# Patient Record
Sex: Female | Born: 2000 | Race: Black or African American | Hispanic: No | Marital: Single | Smoking: Never smoker
Health system: Southern US, Community
[De-identification: ages and names within clinical notes are randomized; demographics above are authoritative.]

## PROBLEM LIST (undated history)

## (undated) DIAGNOSIS — E559 Vitamin D deficiency, unspecified: Secondary | ICD-10-CM

## (undated) DIAGNOSIS — Z789 Other specified health status: Secondary | ICD-10-CM

## (undated) DIAGNOSIS — J45909 Unspecified asthma, uncomplicated: Secondary | ICD-10-CM

## (undated) HISTORY — PX: NO PAST SURGERIES: SHX2092

## (undated) HISTORY — DX: Vitamin D deficiency, unspecified: E55.9

## (undated) HISTORY — DX: Unspecified asthma, uncomplicated: J45.909

---
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
## 2022-01-02 ENCOUNTER — Inpatient Hospital Stay (HOSPITAL_COMMUNITY): Payer: Medicaid Other | Admitting: Anesthesiology

## 2022-01-02 ENCOUNTER — Inpatient Hospital Stay (HOSPITAL_COMMUNITY)
Admission: AD | Admit: 2022-01-02 | Discharge: 2022-01-04 | DRG: 807 | Disposition: A | Discharge status: Home / Self Care | Payer: Medicaid Other | Attending: Obstetrics & Gynecology | Admitting: Obstetrics & Gynecology

## 2022-01-02 ENCOUNTER — Encounter (HOSPITAL_COMMUNITY): Payer: Self-pay | Admitting: Obstetrics & Gynecology

## 2022-01-02 DIAGNOSIS — Z23 Encounter for immunization: Secondary | ICD-10-CM | POA: Diagnosis not present

## 2022-01-02 DIAGNOSIS — O9952 Diseases of the respiratory system complicating childbirth: Principal | ICD-10-CM | POA: Diagnosis present

## 2022-01-02 DIAGNOSIS — Z3A39 39 weeks gestation of pregnancy: Secondary | ICD-10-CM

## 2022-01-02 DIAGNOSIS — Z8613 Personal history of malaria: Secondary | ICD-10-CM

## 2022-01-02 DIAGNOSIS — J45909 Unspecified asthma, uncomplicated: Secondary | ICD-10-CM | POA: Diagnosis present

## 2022-01-02 DIAGNOSIS — O26893 Other specified pregnancy related conditions, third trimester: Secondary | ICD-10-CM | POA: Diagnosis present

## 2022-01-02 LAB — CBC
HCT: 35.2 % — ABNORMAL LOW (ref 36.0–46.0)
Hemoglobin: 11 g/dL — ABNORMAL LOW (ref 12.0–15.0)
MCH: 29.3 pg (ref 26.0–34.0)
MCHC: 31.3 g/dL (ref 30.0–36.0)
MCV: 93.9 fL (ref 80.0–100.0)
Platelets: 216 10*3/uL (ref 150–400)
RBC: 3.75 MIL/uL — ABNORMAL LOW (ref 3.87–5.11)
RDW: 14.1 % (ref 11.5–15.5)
WBC: 8.8 10*3/uL (ref 4.0–10.5)
nRBC: 0 % (ref 0.0–0.2)

## 2022-01-02 LAB — TYPE AND SCREEN
ABO/RH(D): AB POS
Antibody Screen: NEGATIVE

## 2022-01-02 MED ORDER — OXYCODONE HCL 5 MG PO TABS: 10.0000 mg | ORAL_TABLET | ORAL | Status: DC | PRN

## 2022-01-02 MED ORDER — ACETAMINOPHEN 325 MG PO TABS: 650.0000 mg | ORAL_TABLET | ORAL | Status: DC | PRN

## 2022-01-02 MED ORDER — OXYCODONE HCL 5 MG PO TABS: 5.0000 mg | ORAL_TABLET | ORAL | Status: DC | PRN

## 2022-01-02 MED ORDER — LACTATED RINGERS IV SOLN: 500.0000 mL | INTRAVENOUS | Status: DC | PRN

## 2022-01-02 MED ORDER — PHENYLEPHRINE 80 MCG/ML (10ML) SYRINGE FOR IV PUSH (FOR BLOOD PRESSURE SUPPORT)
80.0000 ug | PREFILLED_SYRINGE | INTRAVENOUS | Status: DC | PRN
  Filled 2022-01-02: qty 10

## 2022-01-02 MED ORDER — ONDANSETRON HCL 4 MG/2ML IJ SOLN: 4.0000 mg | Freq: Four times a day (QID) | INTRAMUSCULAR | Status: DC | PRN

## 2022-01-02 MED ORDER — OXYCODONE-ACETAMINOPHEN 5-325 MG PO TABS: 2.0000 | ORAL_TABLET | ORAL | Status: DC | PRN

## 2022-01-02 MED ORDER — PRENATAL MULTIVITAMIN CH
1.0000 | ORAL_TABLET | Freq: Every day | ORAL | Status: DC
  Administered 2022-01-03 – 2022-01-04 (×2): 1 via ORAL
  Filled 2022-01-02 (×2): qty 1

## 2022-01-02 MED ORDER — FENTANYL-BUPIVACAINE-NACL 0.5-0.125-0.9 MG/250ML-% EP SOLN
12.0000 mL/h | EPIDURAL | Status: DC | PRN
  Filled 2022-01-02: qty 250

## 2022-01-02 MED ORDER — EPHEDRINE 5 MG/ML INJ: 10.0000 mg | INTRAVENOUS | Status: DC | PRN

## 2022-01-02 MED ORDER — PHENYLEPHRINE 80 MCG/ML (10ML) SYRINGE FOR IV PUSH (FOR BLOOD PRESSURE SUPPORT): 80.0000 ug | PREFILLED_SYRINGE | INTRAVENOUS | Status: DC | PRN

## 2022-01-02 MED ORDER — DIPHENHYDRAMINE HCL 25 MG PO CAPS: 25.0000 mg | ORAL_CAPSULE | Freq: Four times a day (QID) | ORAL | Status: DC | PRN

## 2022-01-02 MED ORDER — MAGNESIUM HYDROXIDE 400 MG/5ML PO SUSP: 30.0000 mL | ORAL | Status: DC | PRN

## 2022-01-02 MED ORDER — LACTATED RINGERS IV SOLN
500.0000 mL | Freq: Once | INTRAVENOUS | Status: AC
  Administered 2022-01-02: 500 mL via INTRAVENOUS

## 2022-01-02 MED ORDER — OXYTOCIN BOLUS FROM INFUSION
333.0000 mL | Freq: Once | INTRAVENOUS | Status: AC
  Administered 2022-01-02: 333 mL via INTRAVENOUS

## 2022-01-02 MED ORDER — FLEET ENEMA 7-19 GM/118ML RE ENEM: 1.0000 | ENEMA | RECTAL | Status: DC | PRN

## 2022-01-02 MED ORDER — COCONUT OIL OIL: 1.0000 | TOPICAL_OIL | Status: DC | PRN

## 2022-01-02 MED ORDER — SOD CITRATE-CITRIC ACID 500-334 MG/5ML PO SOLN: 30.0000 mL | ORAL | Status: DC | PRN

## 2022-01-02 MED ORDER — ONDANSETRON HCL 4 MG/2ML IJ SOLN: 4.0000 mg | INTRAMUSCULAR | Status: DC | PRN

## 2022-01-02 MED ORDER — SENNOSIDES-DOCUSATE SODIUM 8.6-50 MG PO TABS
2.0000 | ORAL_TABLET | Freq: Every day | ORAL | Status: DC
  Administered 2022-01-03 – 2022-01-04 (×2): 2 via ORAL
  Filled 2022-01-02 (×2): qty 2

## 2022-01-02 MED ORDER — ZOLPIDEM TARTRATE 5 MG PO TABS: 5.0000 mg | ORAL_TABLET | Freq: Every evening | ORAL | Status: DC | PRN

## 2022-01-02 MED ORDER — ONDANSETRON HCL 4 MG PO TABS: 4.0000 mg | ORAL_TABLET | ORAL | Status: DC | PRN

## 2022-01-02 MED ORDER — LIDOCAINE HCL (PF) 1 % IJ SOLN
INTRAMUSCULAR | Status: DC | PRN
  Administered 2022-01-02: 5 mL via EPIDURAL

## 2022-01-02 MED ORDER — LACTATED RINGERS IV SOLN: INTRAVENOUS | Status: DC

## 2022-01-02 MED ORDER — DIPHENHYDRAMINE HCL 50 MG/ML IJ SOLN: 12.5000 mg | INTRAMUSCULAR | Status: DC | PRN

## 2022-01-02 MED ORDER — SIMETHICONE 80 MG PO CHEW: 80.0000 mg | CHEWABLE_TABLET | ORAL | Status: DC | PRN

## 2022-01-02 MED ORDER — EPHEDRINE 5 MG/ML INJ
10.0000 mg | INTRAVENOUS | Status: DC | PRN
  Filled 2022-01-02: qty 5

## 2022-01-02 MED ORDER — IBUPROFEN 600 MG PO TABS
600.0000 mg | ORAL_TABLET | Freq: Four times a day (QID) | ORAL | Status: DC
  Administered 2022-01-02 – 2022-01-04 (×7): 600 mg via ORAL
  Filled 2022-01-02 (×7): qty 1

## 2022-01-02 MED ORDER — OXYTOCIN-SODIUM CHLORIDE 30-0.9 UT/500ML-% IV SOLN
2.5000 [IU]/h | INTRAVENOUS | Status: DC
  Filled 2022-01-02: qty 500

## 2022-01-02 MED ORDER — LIDOCAINE HCL (PF) 1 % IJ SOLN: 30.0000 mL | INTRAMUSCULAR | Status: DC | PRN

## 2022-01-02 MED ORDER — WITCH HAZEL-GLYCERIN EX PADS
1.0000 | MEDICATED_PAD | CUTANEOUS | Status: DC | PRN
  Administered 2022-01-04: 1 via TOPICAL

## 2022-01-02 MED ORDER — BENZOCAINE-MENTHOL 20-0.5 % EX AERO
1.0000 | INHALATION_SPRAY | CUTANEOUS | Status: DC | PRN
  Administered 2022-01-04: 1 via TOPICAL
  Filled 2022-01-02 (×2): qty 56

## 2022-01-02 MED ORDER — FENTANYL-BUPIVACAINE-NACL 0.5-0.125-0.9 MG/250ML-% EP SOLN
EPIDURAL | Status: DC | PRN
  Administered 2022-01-02: 12 mL/h via EPIDURAL

## 2022-01-02 MED ORDER — DIBUCAINE (PERIANAL) 1 % EX OINT: 1.0000 | TOPICAL_OINTMENT | CUTANEOUS | Status: DC | PRN

## 2022-01-02 MED ORDER — ACETAMINOPHEN 325 MG PO TABS
650.0000 mg | ORAL_TABLET | ORAL | Status: DC | PRN
  Administered 2022-01-03 – 2022-01-04 (×2): 650 mg via ORAL
  Filled 2022-01-02 (×2): qty 2

## 2022-01-02 MED ORDER — OXYCODONE-ACETAMINOPHEN 5-325 MG PO TABS: 1.0000 | ORAL_TABLET | ORAL | Status: DC | PRN

## 2022-01-02 NOTE — Anesthesia Procedure Notes (Signed)
Epidural Patient location during procedure: OB Start time: 01/02/2022 11:57 AM End time: 01/02/2022 12:09 PM  Staffing Anesthesiologist: Barnet Glasgow, MD Performed: anesthesiologist   Preanesthetic Checklist Completed: patient identified, IV checked, site marked, risks and benefits discussed, surgical consent, monitors and equipment checked, pre-op evaluation and timeout performed  Epidural Patient position: sitting Prep: DuraPrep and site prepped and draped Patient monitoring: continuous pulse ox and blood pressure Approach: midline Location: L2-L3 Injection technique: LOR air  Needle:  Needle type: Tuohy  Needle gauge: 17 G Needle length: 9 cm and 9 Needle insertion depth: 6 cm Catheter type: closed end flexible Catheter size: 19 Gauge Catheter at skin depth: 12 cm Test dose: negative  Assessment Events: blood not aspirated, injection not painful, no injection resistance, no paresthesia and negative IV test  Additional Notes Patient identified. Risks/Benefits/Options discussed with patient including but not limited to bleeding, infection, nerve damage, paralysis, failed block, incomplete pain control, headache, blood pressure changes, nausea, vomiting, reactions to medication both or allergic, itching and postpartum back pain. Confirmed with bedside nurse the patient's most recent platelet count. Confirmed with patient that they are not currently taking any anticoagulation, have any bleeding history or any family history of bleeding disorders. Patient expressed understanding and wished to proceed. All questions were answered. Sterile technique was used throughout the entire procedure. Please see nursing notes for vital signs. Test dose was given through epidural needle and negative prior to continuing to dose epidural or start infusion. Warning signs of high block given to the patient including shortness of breath, tingling/numbness in hands, complete motor block, or any  concerning symptoms with instructions to call for help. Patient was given instructions on fall risk and not to get out of bed. All questions and concerns addressed with instructions to call with any issues.  1 Attempt (S) . Patient tolerated procedure well.

## 2022-01-02 NOTE — Lactation Note (Signed)
This note was copied from a baby's chart. Lactation Consultation Note  Patient Name: Taylor Ramsey QQPYP'P Date: 01/02/2022 Reason for consult: Initial assessment;Primapara;Term Age:19 hours Baby hadn't been to the breast since born. Assisted baby to breast breast w/much stimulation. Baby sleepy. LC fed breast tissue into baby's mouth, compressing breast expressing colostrum to stimulate baby to suckle at the breast. Baby did suckle at intervals off and on approx 2 minutes but at the breast much longer. Mom asked for formula after feeding. Pace feeding demonstrated. Baby suckled well. Encouraged mom to hold baby upright for approx. 10-15 min. After feeding. Reviewed newborn feeding habits, behavior, STS, I&O, positioning, support. Mom encouraged to feed baby 8-12 times/24 hours and with feeding cues.  Encouraged mom to call for assistance or questions as needed. Mom is breast/formula feeding. Encouraged mom to BF first before formula feeding if necessary.  Maternal Data    Feeding Nipple Type: Nfant Slow Flow (purple)  LATCH Score Latch: Repeated attempts needed to sustain latch, nipple held in mouth throughout feeding, stimulation needed to elicit sucking reflex.  Audible Swallowing: None  Type of Nipple: Everted at rest and after stimulation (short shaft/compressible)  Comfort (Breast/Nipple): Soft / non-tender  Hold (Positioning): Full assist, staff holds infant at breast  LATCH Score: 5   Lactation Tools Discussed/Used    Interventions Interventions: Breast feeding basics reviewed;Adjust position;Assisted with latch;Support pillows;Skin to skin;Position options;Breast massage;Hand express;Breast compression  Discharge    Consult Status Consult Status: Follow-up Date: 01/03/22 Follow-up type: In-patient    Theodoro Kalata 01/02/2022, 8:55 PM

## 2022-01-02 NOTE — H&P (Addendum)
Taylor Ramsey is a 21 y.o. female G1P0 at [redacted] weeks EGA who presented with contractions and was found to be in labor. She denied vaginal bleeding. She reported normal fetal movement. LMP 04/01/2021.  EDC 01/03/2022 by first trimester ultrasound.   Prenatal course was significant for:   Teenage pregnancy. 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    OB History     Gravida  1   Para      Term      Preterm      AB      Living  0      SAB      IAB      Ectopic      Multiple      Live Births             Past Medical History:  Diagnosis Date   Asthma    Malaria 2018   Vitamin D deficiency    Past Surgical History:  Procedure Laterality Date   NO PAST SURGERIES     Family History: family history includes Hypertension in her father; Kidney disease in her father. Social History:  reports that she has never smoked. She has never used smokeless tobacco. She reports that she does not drink alcohol and does not use drugs.     Maternal Diabetes: No Genetic Screening: Normal Maternal Ultrasounds/Referrals: Absent nasal bone.  Fetal Ultrasounds or other Referrals:  None Maternal Substance Abuse:  No Significant Maternal Medications:  None Significant Maternal Lab Results:  Group B Strep negative Number of Prenatal Visits:greater than 3 verified prenatal visits Other Comments:  None  Review of Systems Constitutional: Denies fevers/chills Cardiovascular: Denies chest pain or palpitations Pulmonary: Denies coughing or wheezing Gastrointestinal: Denies nausea, vomiting or diarrhea Genitourinary: Denies unusual vaginal bleeding, unusual vaginal discharge, dysuria, urgency or frequency.  Musculoskeletal: Denies muscle or joint aches and pain.  Neurology: Denies abnormal sensations such as tingling or numbness.   History Dilation: 4-5  Effacement (%): 80 Station: -2 Exam by:: Jeanett Schlein, RN NST: Baseline 120, moderate  variability, reactive.  TOCO{ Contractions every 2 to 3 minutes.  Blood pressure (!) 140/79, pulse (!) 188, temperature 98.8 F (37.1 C), temperature source Oral, resp. rate 17, height 5\' 2"  (1.575 m), last menstrual period 03/30/2021, SpO2 96 %. Exam Physical Exam  Constitutional: She is oriented to person, place, and time. She appears well-developed and well-nourished.  HENT:  Head: Normocephalic and atraumatic.  Neck: Normal range of motion.  Cardiovascular: Normal rate, regular rhythm and normal heart sounds.   Respiratory: Effort normal and breath sounds normal.  GI: Soft. Bowel sounds are normal.  Genitourinary: Gravid uterus, measuring appropriate for gestation age.  Neurological: She is alert and oriented to person, place, and time.  Skin: Skin is warm and dry.  Psychiatric: She has a normal mood and affect. Her behavior is normal.    Current Outpatient Medications  Medication Instructions   acetaminophen (TYLENOL) 325 mg, Oral, Every 6 hours PRN   albuterol (VENTOLIN HFA) 108 (90 Base) MCG/ACT inhaler 2 puffs, Inhalation, Every 4 hours PRN, Dispense with aerochamber   docusate sodium (COLACE) 100 mg, Oral, Every 12 hours   famotidine (PEPCID) 20 mg, Oral, 2 times daily   metoCLOPramide (REGLAN) 10 mg, Oral, Every 6 hours   ondansetron (ZOFRAN-ODT) 8 mg, Oral, Every 8 hours PRN   polyethylene glycol (MIRALAX / GLYCOLAX) 17 g, Oral, Daily, Mix with 6oz of juice or water. May decrease  to half packet if stools are too loose.   Prenatal Vit-Fe Fumarate-FA (MULTIVITAMIN-PRENATAL) 27-0.8 MG TABS tablet 1 tablet, Oral, Daily   promethazine (PHENERGAN) 25 mg, Oral, Every 6 hours PRN   Spacer/Aero-Holding Chambers (AEROCHAMBER PLUS) inhaler Use with inhaler    No Known Allergies   Prenatal labs: ABO, Rh: --/--/AB POS (11/02 1006) Antibody: NEG (11/02 1006) Rubella:  Immune  RPR:   Neg HBsAg:   Neg HIV:   NR GBS:  Neg   Recent Results (from the past 2160 hour(s))  Urinalysis,  Routine w reflex microscopic     Status: None   Collection Time: 10/06/21  2:40 AM  Result Value Ref Range   Color, Urine YELLOW YELLOW   APPearance CLEAR CLEAR   Specific Gravity, Urine 1.016 1.005 - 1.030   pH 7.0 5.0 - 8.0   Glucose, UA NEGATIVE NEGATIVE mg/dL   Hgb urine dipstick NEGATIVE NEGATIVE   Bilirubin Urine NEGATIVE NEGATIVE   Ketones, ur NEGATIVE NEGATIVE mg/dL   Protein, ur NEGATIVE NEGATIVE mg/dL   Nitrite NEGATIVE NEGATIVE   Leukocytes,Ua NEGATIVE NEGATIVE    Comment: Performed at [facility], [address], [place], [place] [address]  OB RESULTS CONSOLE HIV antibody     Status: None   Collection Time: 10/08/21 12:00 AM  Result Value Ref Range   HIV Non-reactive   OB RESULT CONSOLE Group B Strep     Status: None   Collection Time: 12/04/21 12:00 AM  Result Value Ref Range   GBS Negative   Fern Test     Status: None   Collection Time: 12/08/21  5:30 PM  Result Value Ref Range   POCT Fern Test Negative = intact amniotic membranes   Type and screen  [facility]     Status: None   Collection Time: 01/02/22 10:06 AM  Result Value Ref Range   ABO/RH(D) AB POS    Antibody Screen NEG    Sample Expiration      01/05/2022,2359 Performed at [facility], [address] [place], [place] [address]   CBC     Status: Abnormal   Collection Time: 01/02/22 10:45 AM  Result Value Ref Range   WBC 8.8 4.0 - 10.5 K/uL   RBC 3.75 (L) 3.87 - 5.11 MIL/uL   Hemoglobin 11.0 (L) 12.0 - 15.0 g/dL   HCT 02.4 (L) 09.7 - 35.3 %   MCV 93.9 80.0 - 100.0 fL   MCH 29.3 26.0 - 34.0 pg   MCHC 31.3 30.0 - 36.0 g/dL   RDW 29.9 24.2 - 68.3 %   Platelets 216 150 - 400 K/uL   nRBC 0.0 0.0 - 0.2 %    Comment: Performed at [facility], [address], [place], [place] [address]    Assessment/Plan: 21 y/o G1P0 at [redacted] weeks EGA in labor,  Admit to Labor and Delivery as per admit orders May get epidural as desired Anticipate NSVD.   Prescilla Sours, MD.   01/02/2022.

## 2022-01-02 NOTE — Anesthesia Preprocedure Evaluation (Signed)
Anesthesia Evaluation  Patient identified by MRN, date of birth, ID band Patient awake    Reviewed: Allergy & Precautions, NPO status , Patient's Chart, lab work & pertinent test results  Airway Mallampati: II  TM Distance: >3 FB Neck ROM: Full    Dental no notable dental hx. (+) Teeth Intact, Dental Advisory Given   Pulmonary asthma    Pulmonary exam normal breath sounds clear to auscultation       Cardiovascular Exercise Tolerance: Good negative cardio ROS Normal cardiovascular exam Rhythm:Regular Rate:Normal     Neuro/Psych    GI/Hepatic negative GI ROS, Neg liver ROS,,,  Endo/Other    Renal/GU negative Renal ROS     Musculoskeletal   Abdominal   Peds  Hematology Lab Results      Component                Value               Date                             HGB                      11.0 (L)            01/02/2022                HCT                      35.2 (L)            01/02/2022                PLT                      216                 01/02/2022              Anesthesia Other Findings   Reproductive/Obstetrics (+) Pregnancy                             Anesthesia Physical Anesthesia Plan  ASA: 2  Anesthesia Plan: Epidural   Post-op Pain Management:    Induction:   PONV Risk Score and Plan:   Airway Management Planned:   Additional Equipment:   Intra-op Plan:   Post-operative Plan:   Informed Consent: I have reviewed the patients History and Physical, chart, labs and discussed the procedure including the risks, benefits and alternatives for the proposed anesthesia with the patient or authorized representative who has indicated his/her understanding and acceptance.       Plan Discussed with:   Anesthesia Plan Comments: (39.5 wk Primagravida for LEA)       Anesthesia Quick Evaluation

## 2022-01-02 NOTE — MAU Note (Signed)
.  Taylor Ramsey is a 21 y.o. at [redacted]w[redacted]d here in MAU reporting: contractions that started at 0600 and increased to 2-3 minutes around 0800.  Pt denies VB and LOF.  +FM LMP: 03/30/21 Onset of complaint: 0600 Pain score: 10 BP:128/83 P:84 FHT:145 Lab orders placed from triage:    none

## 2022-01-02 NOTE — Progress Notes (Signed)
Taylor Ramsey is a 21 y.o. G1P0 at [redacted]w[redacted]d by  admitted in labor.   Subjective: Patient comfortable with epidural.   Objective: BP (!) 118/96   Pulse (!) 188   Temp 98.8 F (37.1 C) (Oral)   Resp 17   Ht 5\' 2"  (1.575 m)   LMP 03/30/2021 (Exact Date)   SpO2 96%   BMI 29.92 kg/m  I/O last 3 completed shifts: In: -  Out: 654 [Urine:700; Blood:47] No intake/output data recorded.  FHT:  FHR: 140 bpm, variability: moderate,  accelerations:  Present,  decelerations:  Absent UC:   regular, every 3 to 4 minutes SVE:  4/90%/-2. AROM done a 1/28 pm, clear fluid.   Labs: Lab Results  Component Value Date   WBC 8.8 01/02/2022   HGB 11.0 (L) 01/02/2022   HCT 35.2 (L) 01/02/2022   MCV 93.9 01/02/2022   PLT 216 01/02/2022    Assessment / Plan: Spontaneous labor, progressing normally  Labor: Progressing normally and s/p AROM.  Preeclampsia:   None Fetal Wellbeing:  Category I Pain Control:  Epidural I/D:   GBS Negative Anticipated MOD:  NSVD  Archie Endo, MD 01/02/2022, 1:41 PM

## 2022-01-03 ENCOUNTER — Other Ambulatory Visit: Payer: Self-pay

## 2022-01-03 LAB — CBC
HCT: 30.1 % — ABNORMAL LOW (ref 36.0–46.0)
Hemoglobin: 9.4 g/dL — ABNORMAL LOW (ref 12.0–15.0)
MCH: 29 pg (ref 26.0–34.0)
MCHC: 31.2 g/dL (ref 30.0–36.0)
MCV: 92.9 fL (ref 80.0–100.0)
Platelets: 203 10*3/uL (ref 150–400)
RBC: 3.24 MIL/uL — ABNORMAL LOW (ref 3.87–5.11)
RDW: 14.3 % (ref 11.5–15.5)
WBC: 9.3 10*3/uL (ref 4.0–10.5)
nRBC: 0 % (ref 0.0–0.2)

## 2022-01-03 LAB — RPR: RPR Ser Ql: NONREACTIVE

## 2022-01-03 MED ORDER — TETANUS-DIPHTH-ACELL PERTUSSIS 5-2.5-18.5 LF-MCG/0.5 IM SUSY
0.5000 mL | PREFILLED_SYRINGE | Freq: Once | INTRAMUSCULAR | Status: AC
  Administered 2022-01-03: 0.5 mL via INTRAMUSCULAR
  Filled 2022-01-03: qty 0.5

## 2022-01-03 MED ORDER — PNEUMOCOCCAL VAC POLYVALENT 25 MCG/0.5ML IJ INJ
0.5000 mL | INJECTION | INTRAMUSCULAR | Status: AC
  Administered 2022-01-04: 0.5 mL via INTRAMUSCULAR
  Filled 2022-01-03: qty 0.5

## 2022-01-03 MED ORDER — INFLUENZA VAC SPLIT QUAD 0.5 ML IM SUSY
0.5000 mL | PREFILLED_SYRINGE | INTRAMUSCULAR | Status: AC
  Administered 2022-01-03: 0.5 mL via INTRAMUSCULAR
  Filled 2022-01-03: qty 0.5

## 2022-01-03 NOTE — Progress Notes (Signed)
PPD# 1 SVD w/ 2nd degree;Perineal;Labial- right labia majora and right labia minora.  Information for the patient's newborn:  Torey, Regan [[id number]]  female   Baby Name Zayn Circumcision desires in-patient   S:   Reports feeling good Tolerating PO fluid and solids No nausea or vomiting Bleeding is light Pain controlled with acetaminophen and ibuprofen (OTC) Up ad lib / ambulatory / voiding w/o difficulty Feeding: Bottle and Breast    O:   VS: BP 123/74 (BP Location: Right Arm)   Pulse 84   Temp 98.2 F (36.8 C) (Oral)   Resp 18   Ht 5\' 2"  (1.575 m)   Wt 75.3 kg   LMP 03/30/2021 (Exact Date)   SpO2 99%   BMI 30.36 kg/m   LABS:  Recent Labs    01/02/22 1045 01/03/22 0310  WBC 8.8 9.3  HGB 11.0* 9.4*  PLT 216 203   Blood type: --/--/AB POS (11/02 1006) Rubella: Immune (04/19 0000)                      I&O: Intake/Output      11/02 0701 11/03 0700 11/03 0701 11/04 0700   Urine (mL/kg/hr) 700    Blood 47    Total Output 747    Net -747           Physical Exam: Alert and oriented X3 Lungs: Clear and unlabored Heart: regular rate and rhythm / mild murmur Abdomen: soft, non-tender, non-distended  Fundus: firm, non-tender, U+1 deviated right Perineum: well-approximated Lochia: appropriate Extremities: no edema, no calf pain, tenderness, or cords    A:  PPD # 1  Normal exam  P:  Routine post partum orders Lactation support PRN Anticipate D/C on 01/04/22    Plan reviewed w/ Dr. Jethro Bastos, DNP, CNM 01/03/2022, 9:27 AM

## 2022-01-03 NOTE — Lactation Note (Signed)
This note was copied from a baby's chart. Lactation Consultation Note  Patient Name: Taylor Ramsey [id number] Date: 01/03/2022   Age:18 hours RN will ask Birth Parent if she would like to be seen by [place] and call [place] on Vocera.  Maternal Data    Feeding    LATCH Score                    Lactation Tools Discussed/Used    Interventions    Discharge    Consult Status      Eulis Canner 01/03/2022, 11:47 PM

## 2022-01-03 NOTE — Anesthesia Postprocedure Evaluation (Signed)
Anesthesia Post Note  Patient: Taylor Ramsey  Procedure(s) Performed: AN AD HOC LABOR EPIDURAL     Patient location during evaluation: Mother Baby Anesthesia Type: Epidural Level of consciousness: awake and alert Pain management: pain level controlled Vital Signs Assessment: post-procedure vital signs reviewed and stable Respiratory status: spontaneous breathing, nonlabored ventilation and respiratory function stable Cardiovascular status: stable Postop Assessment: no headache, no backache and epidural receding Anesthetic complications: no   No notable events documented.  Last Vitals:  Vitals:   01/02/22 2100 01/03/22 0100  BP: 124/72 115/74  Pulse: 75 70  Resp: 16 16  Temp: 36.7 C 36.6 C  SpO2: 99% 100%    Last Pain:  Vitals:   01/03/22 0500  TempSrc:   PainSc: 0-No pain   Pain Goal: Patients Stated Pain Goal: 0 (01/02/22 1150)                 Levi Crass

## 2022-01-04 MED ORDER — IBUPROFEN 600 MG PO TABS: 600.0000 mg | ORAL_TABLET | Freq: Four times a day (QID) | ORAL | 0 refills | Status: DC

## 2022-01-04 NOTE — Discharge Summary (Signed)
Postpartum Discharge Summary  Date of Service updated 01/04/22    Patient Name: Taylor Ramsey DOB: [birth year] MRN: [id number]  Date of admission: 01/02/2022 Delivery date:01/02/2022  Delivering provider: Waymon Amato  Date of discharge: 01/04/2022  Admitting diagnosis: Normal labor [O80, Z37.9] Vaginal delivery [O80] Intrauterine pregnancy: [redacted]w[redacted]d    Secondary diagnosis:  Principal Problem:   Normal labor Active Problems:   Vaginal delivery  Additional problems: none    Discharge diagnosis: Term Pregnancy Delivered                                              Post partum procedures: none Augmentation: N/A Complications: None  Hospital course: Onset of Labor With Vaginal Delivery      21y.o. yo G1P0 at 427w0das admitted in Latent Labor on 01/02/2022. Labor course was uncomplicated  Membrane Rupture Time/Date: 1:28 PM ,01/02/2022   Delivery Method:Vaginal, Vacuum (Extractor)  Episiotomy: None  Lacerations:  2nd degree;Perineal;Labial  Patient had a postpartum course was uncomplicated.  She is ambulating, tolerating a regular diet, passing flatus, and urinating well. Patient is discharged home in stable condition on 01/04/22.  Newborn Data: Birth date:01/02/2022  Birth time:5:33 PM  Gender:Female  Living status:Living  Apgars:8 ,9  Weight:3060 g   Magnesium Sulfate received: No BMZ received: No Rhophylac:N/A MMR:N/A Transfusion:No  Physical exam  Vitals:   01/03/22 0902 01/03/22 1450 01/03/22 2127 01/04/22 0622  BP: 123/74 111/65 115/73 121/69  Pulse: 84 86 (!) 102 86  Resp: _0 Temp: 98.2 F (36.8 C) 98 F (36.7 C) 98 F (36.7 C) 98.6 F (37 C)  TempSrc: Oral Oral Oral   SpO2: 99% 100% 100% 100%  Weight:      Height:       General: alert, cooperative, and no distress Lochia: appropriate Uterine Fundus: firm Incision: N/A DVT Evaluation: No evidence of DVT seen on physical exam. No cords or calf tenderness. No significant calf/ankle  edema. Labs: Lab Results  Component Value Date   WBC 9.3 01/03/2022   HGB 9.4 (L) 01/03/2022   HCT 30.1 (L) 01/03/2022   MCV 92.9 01/03/2022   PLT 203 01/03/2022      Latest Ref Rng & Units 05/29/2021   12:50 AM  CMP  Glucose 70 - 99 mg/dL 85   BUN 6 - 20 mg/dL 11   Creatinine 0.44 - 1.00 mg/dL 0.68   Sodium 135 - 145 mmol/L 133   Potassium 3.5 - 5.1 mmol/L 3.6   Chloride 98 - 111 mmol/L 105   CO2 22 - 32 mmol/L 13   Calcium 8.9 - 10.3 mg/dL 10.5   Total Protein 6.5 - 8.1 g/dL 7.9   Total Bilirubin 0.3 - 1.2 mg/dL 1.5   Alkaline Phos 38 - 126 U/L 42   AST 15 - 41 U/L 22   ALT 0 - 44 U/L 13    Edinburgh Score:    01/03/2022   12:19 PM  Edinburgh Postnatal Depression Scale Screening Tool  I have been able to laugh and see the funny side of things. 0  I have looked forward with enjoyment to things. 0  I have blamed myself unnecessarily when things went wrong. 1  I have been anxious or worried for no good reason. 1  I have felt scared or panicky for no good reason. 1  Things have been getting on top of me. 0  I have been so unhappy that I have had difficulty sleeping. 0  I have felt sad or miserable. 0  I have been so unhappy that I have been crying. 0  The thought of harming myself has occurred to me. 0  Edinburgh Postnatal Depression Scale Total 3      After visit meds:  Allergies as of 01/04/2022   No Known Allergies      Medication List     STOP taking these medications    docusate sodium 100 MG capsule Commonly known as: COLACE   famotidine 20 MG tablet Commonly known as: PEPCID   metoCLOPramide 10 MG tablet Commonly known as: REGLAN   ondansetron 8 MG disintegrating tablet Commonly known as: ZOFRAN-ODT   polyethylene glycol 17 g packet Commonly known as: MIRALAX / GLYCOLAX   promethazine 25 MG tablet Commonly known as: PHENERGAN       TAKE these medications    acetaminophen 325 MG tablet Commonly known as: TYLENOL Take 325 mg by mouth  as needed for headache.   albuterol 108 (90 Base) MCG/ACT inhaler Commonly known as: VENTOLIN HFA Inhale 2 puffs into the lungs every 4 (four) hours as needed for wheezing or shortness of breath. Dispense with aerochamber   ibuprofen 600 MG tablet Commonly known as: ADVIL Take 1 tablet (600 mg total) by mouth every 6 (six) hours.   multivitamin-prenatal 27-0.8 MG Tabs tablet Take 1 tablet by mouth daily at 12 noon.         Discharge home in stable condition Infant Feeding: Bottle Infant Disposition:home with mother Discharge instruction: per After Visit Summary and Postpartum booklet. Activity: Advance as tolerated. Pelvic rest for 6 weeks.  Diet: routine diet Anticipated Birth Control: Unsure and options provided Postpartum Appointment:6 weeks Additional Postpartum F/U:  none Future Appointments:No future appointments. Follow up Visit:  Follow-up Information     Waymon Amato, MD. Schedule an appointment as soon as possible for a visit in 6 week(s).   Specialty: Obstetrics and Gynecology Why: Postpartum check. Contact information: [address] [place] [address] [phone]                     01/04/2022 Arrie Eastern, CNM

## 2022-01-04 NOTE — Lactation Note (Signed)
This note was copied from a baby's chart. Lactation Consultation Note  Patient Name: Taylor Ramsey [id number] Date: 01/04/2022   Age:19 hours Birth Parent requested be seen by [place] when [facility] entered room, Birth Parent and infant asleep at this time. RN will ask when doing next assessment on Birth Parent and infant if want be seen if before [place] ends shift at 0300 am. Maternal Data    Feeding Nipple Type: Extra Slow Flow  LATCH Score                    Lactation Tools Discussed/Used    Interventions    Discharge    Consult Status      Eulis Canner 01/04/2022, 12:33 AM

## 2022-01-06 LAB — GC/CHLAMYDIA PROBE AMP (~~LOC~~) NOT AT ARMC
Chlamydia: NEGATIVE
Comment: NEGATIVE
Comment: NORMAL
Neisseria Gonorrhea: NEGATIVE

## 2022-01-10 ENCOUNTER — Encounter (HOSPITAL_COMMUNITY): Payer: Self-pay

## 2022-01-10 ENCOUNTER — Inpatient Hospital Stay (HOSPITAL_COMMUNITY)
Admission: EM | Admit: 2022-01-10 | Discharge: 2022-01-13 | DRG: 769 | Disposition: A | Discharge status: Home / Self Care | Payer: Medicaid Other | Attending: Internal Medicine | Admitting: Internal Medicine

## 2022-01-10 ENCOUNTER — Other Ambulatory Visit: Payer: Self-pay

## 2022-01-10 ENCOUNTER — Emergency Department (HOSPITAL_COMMUNITY): Payer: Medicaid Other

## 2022-01-10 DIAGNOSIS — Z841 Family history of disorders of kidney and ureter: Secondary | ICD-10-CM

## 2022-01-10 DIAGNOSIS — N10 Acute tubulo-interstitial nephritis: Secondary | ICD-10-CM | POA: Diagnosis present

## 2022-01-10 DIAGNOSIS — O99285 Endocrine, nutritional and metabolic diseases complicating the puerperium: Secondary | ICD-10-CM | POA: Diagnosis present

## 2022-01-10 DIAGNOSIS — G9341 Metabolic encephalopathy: Secondary | ICD-10-CM | POA: Diagnosis present

## 2022-01-10 DIAGNOSIS — J45909 Unspecified asthma, uncomplicated: Secondary | ICD-10-CM | POA: Diagnosis present

## 2022-01-10 DIAGNOSIS — E876 Hypokalemia: Secondary | ICD-10-CM | POA: Diagnosis present

## 2022-01-10 DIAGNOSIS — N12 Tubulo-interstitial nephritis, not specified as acute or chronic: Principal | ICD-10-CM | POA: Diagnosis present

## 2022-01-10 DIAGNOSIS — N201 Calculus of ureter: Secondary | ICD-10-CM | POA: Insufficient documentation

## 2022-01-10 DIAGNOSIS — Z9889 Other specified postprocedural states: Secondary | ICD-10-CM

## 2022-01-10 DIAGNOSIS — B9689 Other specified bacterial agents as the cause of diseases classified elsewhere: Secondary | ICD-10-CM | POA: Diagnosis present

## 2022-01-10 DIAGNOSIS — O9081 Anemia of the puerperium: Secondary | ICD-10-CM | POA: Diagnosis present

## 2022-01-10 DIAGNOSIS — A419 Sepsis, unspecified organism: Secondary | ICD-10-CM | POA: Insufficient documentation

## 2022-01-10 DIAGNOSIS — E162 Hypoglycemia, unspecified: Secondary | ICD-10-CM | POA: Diagnosis present

## 2022-01-10 DIAGNOSIS — O9953 Diseases of the respiratory system complicating the puerperium: Secondary | ICD-10-CM | POA: Diagnosis present

## 2022-01-10 DIAGNOSIS — R310 Gross hematuria: Secondary | ICD-10-CM | POA: Diagnosis present

## 2022-01-10 DIAGNOSIS — N136 Pyonephrosis: Secondary | ICD-10-CM | POA: Diagnosis present

## 2022-01-10 DIAGNOSIS — Z1152 Encounter for screening for COVID-19: Secondary | ICD-10-CM

## 2022-01-10 DIAGNOSIS — Z8613 Personal history of malaria: Secondary | ICD-10-CM

## 2022-01-10 DIAGNOSIS — O85 Puerperal sepsis: Principal | ICD-10-CM | POA: Diagnosis present

## 2022-01-10 DIAGNOSIS — N1330 Unspecified hydronephrosis: Secondary | ICD-10-CM | POA: Insufficient documentation

## 2022-01-10 DIAGNOSIS — Z79899 Other long term (current) drug therapy: Secondary | ICD-10-CM

## 2022-01-10 LAB — COMPREHENSIVE METABOLIC PANEL
ALT: 13 U/L (ref 0–44)
AST: 18 U/L (ref 15–41)
Albumin: 2.9 g/dL — ABNORMAL LOW (ref 3.5–5.0)
Alkaline Phosphatase: 96 U/L (ref 38–126)
Anion gap: 12 (ref 5–15)
BUN: 14 mg/dL (ref 6–20)
CO2: 19 mmol/L — ABNORMAL LOW (ref 22–32)
Calcium: 8.5 mg/dL — ABNORMAL LOW (ref 8.9–10.3)
Chloride: 107 mmol/L (ref 98–111)
Creatinine, Ser: 0.92 mg/dL (ref 0.44–1.00)
GFR, Estimated: >60 mL/min (ref >60)
Glucose, Bld: 197 mg/dL — ABNORMAL HIGH (ref 70–99)
Potassium: 3.3 mmol/L — ABNORMAL LOW (ref 3.5–5.1)
Sodium: 138 mmol/L (ref 135–145)
Total Bilirubin: 0.7 mg/dL (ref 0.3–1.2)
Total Protein: 6.6 g/dL (ref 6.5–8.1)

## 2022-01-10 LAB — CBC WITH DIFFERENTIAL/PLATELET
Abs Immature Granulocytes: 0.15 10*3/uL — ABNORMAL HIGH (ref 0.00–0.07)
Basophils Absolute: 0 10*3/uL (ref 0.0–0.1)
Basophils Relative: 0 %
Eosinophils Absolute: 0 10*3/uL (ref 0.0–0.5)
Eosinophils Relative: 0 %
HCT: 32.1 % — ABNORMAL LOW (ref 36.0–46.0)
Hemoglobin: 10.3 g/dL — ABNORMAL LOW (ref 12.0–15.0)
Immature Granulocytes: 1 %
Lymphocytes Relative: 6 %
Lymphs Abs: 0.7 10*3/uL (ref 0.7–4.0)
MCH: 29.5 pg (ref 26.0–34.0)
MCHC: 32.1 g/dL (ref 30.0–36.0)
MCV: 92 fL (ref 80.0–100.0)
Monocytes Absolute: 0.2 10*3/uL (ref 0.1–1.0)
Monocytes Relative: 2 %
Neutro Abs: 10.2 10*3/uL — ABNORMAL HIGH (ref 1.7–7.7)
Neutrophils Relative %: 91 %
Platelets: 232 10*3/uL (ref 150–400)
RBC: 3.49 MIL/uL — ABNORMAL LOW (ref 3.87–5.11)
RDW: 14 % (ref 11.5–15.5)
WBC: 11.3 10*3/uL — ABNORMAL HIGH (ref 4.0–10.5)
nRBC: 0 % (ref 0.0–0.2)

## 2022-01-10 LAB — CBG MONITORING, ED
Glucose-Capillary: 30 mg/dL — CL (ref 70–99)
Glucose-Capillary: 94 mg/dL (ref 70–99)

## 2022-01-10 LAB — PROTIME-INR
INR: 1.1 (ref 0.8–1.2)
Prothrombin Time: 14.5 seconds (ref 11.4–15.2)

## 2022-01-10 LAB — APTT: aPTT: 28 seconds (ref 24–36)

## 2022-01-10 LAB — LACTIC ACID, PLASMA: Lactic Acid, Venous: 1.8 mmol/L (ref 0.5–1.9)

## 2022-01-10 MED ORDER — MORPHINE SULFATE (PF) 4 MG/ML IV SOLN
4.0000 mg | Freq: Once | INTRAVENOUS | Status: AC
  Administered 2022-01-10: 4 mg via INTRAVENOUS
  Filled 2022-01-10: qty 1

## 2022-01-10 MED ORDER — VANCOMYCIN HCL IN DEXTROSE 1-5 GM/200ML-% IV SOLN
1000.0000 mg | Freq: Once | INTRAVENOUS | Status: AC
  Administered 2022-01-10: 1000 mg via INTRAVENOUS
  Filled 2022-01-10: qty 200

## 2022-01-10 MED ORDER — DEXTROSE 10 % IV BOLUS: 500.0000 mL | Freq: Once | INTRAVENOUS | Status: DC

## 2022-01-10 MED ORDER — DEXTROSE 50 % IV SOLN
50.0000 mL | Freq: Once | INTRAVENOUS | Status: AC
  Administered 2022-01-10: 50 mL via INTRAVENOUS
  Filled 2022-01-10: qty 50

## 2022-01-10 MED ORDER — LACTATED RINGERS IV BOLUS
1000.0000 mL | Freq: Once | INTRAVENOUS | Status: AC
  Administered 2022-01-10: 1000 mL via INTRAVENOUS

## 2022-01-10 MED ORDER — LACTATED RINGERS IV SOLN: INTRAVENOUS | Status: AC

## 2022-01-10 MED ORDER — POTASSIUM CHLORIDE CRYS ER 20 MEQ PO TBCR
40.0000 meq | EXTENDED_RELEASE_TABLET | Freq: Once | ORAL | Status: AC
  Administered 2022-01-11: 40 meq via ORAL
  Filled 2022-01-10: qty 2

## 2022-01-10 MED ORDER — ACETAMINOPHEN 650 MG RE SUPP
650.0000 mg | Freq: Once | RECTAL | Status: AC
  Administered 2022-01-10: 650 mg via RECTAL
  Filled 2022-01-10: qty 1

## 2022-01-10 MED ORDER — SODIUM CHLORIDE 0.9 % IV SOLN
2.0000 g | Freq: Once | INTRAVENOUS | Status: AC
  Administered 2022-01-10: 2 g via INTRAVENOUS
  Filled 2022-01-10: qty 20

## 2022-01-10 MED ORDER — LIDOCAINE HCL 2 % IJ SOLN
10.0000 mL | Freq: Once | INTRAMUSCULAR | Status: DC
  Filled 2022-01-10: qty 20

## 2022-01-10 NOTE — ED Notes (Signed)
Multiple attempts at IV access unsuccessful - PA at bedside with [place]

## 2022-01-10 NOTE — ED Notes (Signed)
Dr Criss Alvine at bedside to attempt IV

## 2022-01-10 NOTE — ED Notes (Signed)
Dextrose given.

## 2022-01-10 NOTE — ED Provider Notes (Signed)
Angiocath insertion Performed by: Audree Camel  Consent: Verbal consent obtained. Risks and benefits: risks, benefits and alternatives were discussed Time out: Immediately prior to procedure a "time out" was called to verify the correct patient, procedure, equipment, support staff and site/side marked as required.  Preparation: Patient was prepped and draped in the usual sterile fashion.  Vein Location: right basilic  Ultrasound Guided  Gauge: 18  Normal blood return and flush without difficulty Patient tolerance: Patient tolerated the procedure well with no immediate complications.     Pricilla Loveless, MD 01/10/22 2221

## 2022-01-10 NOTE — ED Notes (Addendum)
Still unable to obtain any reliable IV access - PA is aware - IV team paged at this time

## 2022-01-10 NOTE — ED Notes (Signed)
PA notified of critical CBG

## 2022-01-10 NOTE — ED Notes (Signed)
Pt significant other at bedside states that pt does not want LP and states that pt is now alert and conscious. PA notified

## 2022-01-10 NOTE — ED Provider Notes (Signed)
[facility] EMERGENCY DEPARTMENT Provider Note   CSN: [id number] Arrival date & time: 01/10/22  2107     History  Chief Complaint  Patient presents with   Altered Mental Status    Taylor Ramsey is a 21 y.o. female.  The history is provided by a significant other. No language interpreter was used.  Altered Mental Status    21 year old female significant history of asthma, malaria, who is 1 weeks postpartum presenting for evaluation of altered mental status.  History obtained through her boyfriend who is at bedside.  Patient had a vaginal delivery a week ago and had an epidural at that time.  She was complaining of pain throughout her body today and subsequently became nonverbal and not responsive which concerns him.  She was found to have a fever as well.  No report of any vomiting or diarrhea cough.  This is her first pregnancy.  Level 5 caveat due to altered mental status.  Home Medications Prior to Admission medications   Medication Sig Start Date End Date Taking? Authorizing Provider  acetaminophen (TYLENOL) 325 MG tablet Take 325 mg by mouth as needed for headache.    [provider]  albuterol (VENTOLIN HFA) 108 (90 Base) MCG/ACT inhaler Inhale 2 puffs into the lungs every 4 (four) hours as needed for wheezing or shortness of breath. Dispense with aerochamber Patient not taking: Reported on 09/30/2021 12/30/20   Melynda Ripple, MD  ibuprofen (ADVIL) 600 MG tablet Take 1 tablet (600 mg total) by mouth every 6 (six) hours. 01/04/22   Arrie Eastern, CNM  Prenatal Vit-Fe Fumarate-FA (MULTIVITAMIN-PRENATAL) 27-0.8 MG TABS tablet Take 1 tablet by mouth daily at 12 noon.    [provider]      Allergies    Patient has no known allergies.    Review of Systems   Review of Systems  Unable to perform ROS: Mental status change    Physical Exam Updated Vital Signs BP 127/79   Pulse 94   Temp (!) 100.7 F (38.2 C) (Oral)   Resp (!) 31    Ht 5\' 2"  (1.575 m)   Wt 75.3 kg   LMP 03/30/2021 (Exact Date)   SpO2 100%   BMI 30.36 kg/m  Physical Exam Vitals and nursing note reviewed.  Constitutional:      General: She is not in acute distress.    Appearance: She is well-developed. She is ill-appearing.     Comments: Ill-appearing female laying in bed appears tremulous, does not appears to follow command.  Eyes open but does not track.  HENT:     Head: Atraumatic.  Eyes:     Conjunctiva/sclera: Conjunctivae normal.  Cardiovascular:     Rate and Rhythm: Normal rate and regular rhythm.     Pulses: Normal pulses.     Heart sounds: Normal heart sounds.  Pulmonary:     Effort: Pulmonary effort is normal.     Comments: Tachypneic Abdominal:     Palpations: Abdomen is soft.  Musculoskeletal:     Cervical back: Neck supple.  Skin:    Findings: No rash.  Neurological:     Mental Status: She is alert.     GCS: GCS eye subscore is 4. GCS verbal subscore is 1. GCS motor subscore is 5.     Cranial Nerves: No facial asymmetry.  Psychiatric:        Mood and Affect: Mood normal.     ED Results / Procedures / Treatments   Labs (  all labs ordered are listed, but only abnormal results are displayed) Labs Reviewed  COMPREHENSIVE METABOLIC PANEL - Abnormal; Notable for the following components:      Result Value   Potassium 3.3 (*)    CO2 19 (*)    Glucose, Bld 197 (*)    Calcium 8.5 (*)    Albumin 2.9 (*)    All other components within normal limits  CBC WITH DIFFERENTIAL/PLATELET - Abnormal; Notable for the following components:   WBC 11.3 (*)    RBC 3.49 (*)    Hemoglobin 10.3 (*)    HCT 32.1 (*)    Neutro Abs 10.2 (*)    Abs Immature Granulocytes 0.15 (*)    All other components within normal limits  CBG MONITORING, ED - Abnormal; Notable for the following components:   Glucose-Capillary 30 (*)    All other components within normal limits  CULTURE, BLOOD (ROUTINE X 2)  CULTURE, BLOOD (ROUTINE X 2)  URINE CULTURE   RESP PANEL BY RT-PCR (FLU A&B, COVID) ARPGX2  CSF CULTURE W GRAM STAIN  LACTIC ACID, PLASMA  PROTIME-INR  APTT  URINALYSIS, ROUTINE W REFLEX MICROSCOPIC  CSF CELL COUNT WITH DIFFERENTIAL  PROTEIN AND GLUCOSE, CSF  HSV 1/2 PCR, CSF  CRYPTOCOCCAL ANTIGEN, CSF  CBG MONITORING, ED  CYTOLOGY - NON PAP    EKG None  Radiology DG Chest Port 1 View  Result Date: 01/10/2022 CLINICAL DATA:  Postpartum pain EXAM: PORTABLE CHEST 1 VIEW COMPARISON:  None Available. FINDINGS: Low lung volumes. Cardiac size upper normal. Central bronchovascular crowding. No acute airspace disease, pleural effusion or pneumothorax IMPRESSION: Low lung volumes with central bronchovascular crowding. No acute airspace disease. Electronically Signed   By: Donavan Foil M.D.   On: 01/10/2022 23:04    Procedures .Critical Care  Performed by: Domenic Moras, PA-C Authorized by: Domenic Moras, PA-C   Critical care provider statement:    Critical care time (minutes):  45   Critical care was time spent personally by me on the following activities:  Development of treatment plan with patient or surrogate, discussions with consultants, evaluation of patient's response to treatment, examination of patient, ordering and review of laboratory studies, ordering and review of radiographic studies, ordering and performing treatments and interventions, pulse oximetry, re-evaluation of patient's condition and review of old charts     EMERGENCY DEPARTMENT  US GUIDANCE EXAM Emergency Ultrasound:  US Guidance for Needle Guidance    Medications Ordered in ED Medications  lactated ringers infusion (has no administration in time range)  vancomycin (VANCOCIN) IVPB 1000 mg/200 mL premix (1,000 mg Intravenous New Bag/Given 01/10/22 2248)  lidocaine (XYLOCAINE) 2 % (with pres) injection 200 mg (0 mg Intradermal Hold 01/10/22 2314)  dextrose 50 % solution 50 mL (50 mLs Intravenous Given 01/10/22 2159)  acetaminophen (TYLENOL) suppository  650 mg (650 mg Rectal Given 01/10/22 2159)  cefTRIAXone (ROCEPHIN) 2 g in sodium chloride 0.9 % 100 mL IVPB (0 g Intravenous Stopped 01/10/22 2326)  lactated ringers bolus 1,000 mL (1,000 mLs Intravenous New Bag/Given 01/10/22 2244)  morphine (PF) 4 MG/ML injection 4 mg (4 mg Intravenous Given 01/10/22 2326)    ED Course/ Medical Decision Making/ A&P                           Medical Decision Making Amount and/or Complexity of Data Reviewed Labs: ordered. Radiology: ordered. ECG/medicine tests: ordered.  Risk OTC drugs. Prescription drug management.   BP 118/69  Pulse 97   Temp (!) 100.7 F (38.2 C) (Oral)   Resp (!) 26   Ht 5\' 2"  (1.575 m)   Wt 75.3 kg   LMP 03/30/2021 (Exact Date)   SpO2 100%   BMI 30.36 kg/m   69:59 PM  21 year old female significant history of asthma, malaria, who is 1 weeks postpartum presenting for evaluation of altered mental status.  History obtained through her boyfriend who is at bedside.  Patient had a vaginal delivery a week ago and had an epidural at that time.  She was complaining of pain throughout her body today and subsequently became nonverbal and not responsive which concerns him.  She was found to have a fever as well.  No report of any vomiting or diarrhea cough.  This is her first pregnancy.  Level 5 caveat due to altered mental status.  EMR reviewed, pt had IUP at [redacted]w[redacted]d via normal vaginal delivery.  On exam, patient is laying in bed appears tearful, eyes open, however is nonverbal and does not follows command.  She does withdraw to painful stimuli.  She does wiggle her toes when asked however does not squeeze my hand.  Vital signs remarkable for an elevated temperature of 100.7.  She is tachypneic with respiratory rate of 26.  She is not hypoxic and she is not hypotensive.  Initial CBG of 30, will give an amp of D50 and will recheck.  Code sepsis initiated.  May consider LP if patient shows no improvement of her symptoms after corrected  CBG.  10:26 PM Patient is a difficult IV access.  I attempted ultrasound-guided IV on her right upper arm without success.  We will continue with current management.  I have initiated broad-spectrum antibiotic including Rocephin and vancomycin for fever of unknown origin.   10:36 PM CBG improved to 97 however there is no change in patient's mental status.  Will obtain head CT scan but anticipate perform lumbar puncture to rule out meningitis.  Patient was placed on broad-spectrum antibiotic and IV fluid.  Family member made aware.  11:05 PM On reassessment, patient is more alert.  She knows that she is in the hospital, she notes that she recently delivered a baby.  She does complain of headache and body aches.  I recommend lumbar puncture at this time however patient initially declined. I discussed risk and benefit with patient and family member.  I felt if no other obvious source of infection identify pt may benefit from LP.  At this time pt is agreeable with procedure.  However, head CT scan have not been performed yet.    Since pt is 1 week post partum, I have also reached out to [facility] obstetrician team and spoke with Dr. Darci Current [phone] from [facility] who recommend continue with workup.  If no obvious source of infection then she will admit.    12:01 AM Pt sign out to oncoming team who will f/u on UA result, head CT scan, covid/flu/rsv test.  IF negative, will consider LP and admission  This patient presents to the ED for concern of altered mental status, this involves an extensive number of treatment options, and is a complaint that carries with it a high risk of complications and morbidity.  The differential diagnosis includes meningitis, post partum depression, viral infection, uti, electrolytes derangement, stroke  Co morbidities that complicate the patient evaluation post partum Additional history obtained:  Additional history obtained from boyfriend External records from  outside source obtained and reviewed including  EMR  Lab Tests:  I Ordered, and personally interpreted labs.  The pertinent results include:  WBC 11.3, CBG 30 which improves with D50, K+ 3.3, will give supplementation, normal lactic acid  Imaging Studies ordered:  I ordered imaging studies including CXR I independently visualized and interpreted imaging which showed no acute infiltrates I agree with the radiologist interpretation  Cardiac Monitoring:  The patient was maintained on a cardiac monitor.  I personally viewed and interpreted the cardiac monitored which showed an underlying rhythm of: NSR  Medicines ordered and prescription drug management:  I ordered medication including rocephin/vancomycin  for FUO Reevaluation of the patient after these medicines showed that the patient improved I have reviewed the patients home medicines and have made adjustments as needed  Test Considered: Lumbar puncture to r/o meningitis  Critical Interventions: sepsis protocol  I have consulted care with attending Dr. Criss Alvine   Problem List / ED Course: altered mental status  Fever unknown origin    Reevaluation:  After the interventions noted above, I reevaluated the patient and found that they have :improved  Social Determinants of Health:   Dispostion:  After consideration of the diagnostic results and the patients response to treatment, I feel that the patent would benefit from admission.         Final Clinical Impression(s) / ED Diagnoses Final diagnoses:  None    Rx / DC Orders ED Discharge Orders     None         Fayrene Helper, PA-C 01/11/22 0001    Pricilla Loveless, MD 01/14/22 [phone]

## 2022-01-10 NOTE — ED Notes (Signed)
PA at bedside.

## 2022-01-10 NOTE — ED Notes (Signed)
PA notified unable to get second set of cultures

## 2022-01-10 NOTE — ED Triage Notes (Signed)
PT BIB EMS from home. Pt is 1 week postpartum - this is her first pregnancy. No reported complications with pregnancy. Per sig. Other pt has been complaining of pain all over today. Sig. Other reports 1.5 hr ago pt began not talking, not following commands. Pt will track movement with eyes but is not speaking and will not follow commands

## 2022-01-10 NOTE — ED Notes (Signed)
Pt able to answer all questions appropriately. Sister and mother at the bedside to translate.

## 2022-01-11 ENCOUNTER — Emergency Department (HOSPITAL_COMMUNITY): Payer: Medicaid Other | Admitting: Anesthesiology

## 2022-01-11 ENCOUNTER — Other Ambulatory Visit (HOSPITAL_COMMUNITY): Payer: Medicaid Other

## 2022-01-11 ENCOUNTER — Encounter (HOSPITAL_COMMUNITY): Payer: Self-pay

## 2022-01-11 ENCOUNTER — Emergency Department (HOSPITAL_COMMUNITY): Payer: Medicaid Other

## 2022-01-11 ENCOUNTER — Encounter (HOSPITAL_COMMUNITY)
Admission: EM | Disposition: A | Discharge status: Home / Self Care | Payer: Self-pay | Source: Home / Self Care | Attending: Internal Medicine

## 2022-01-11 DIAGNOSIS — B9689 Other specified bacterial agents as the cause of diseases classified elsewhere: Secondary | ICD-10-CM | POA: Diagnosis present

## 2022-01-11 DIAGNOSIS — R310 Gross hematuria: Secondary | ICD-10-CM | POA: Diagnosis present

## 2022-01-11 DIAGNOSIS — Z79899 Other long term (current) drug therapy: Secondary | ICD-10-CM | POA: Diagnosis not present

## 2022-01-11 DIAGNOSIS — Z9889 Other specified postprocedural states: Secondary | ICD-10-CM | POA: Diagnosis not present

## 2022-01-11 DIAGNOSIS — N10 Acute tubulo-interstitial nephritis: Secondary | ICD-10-CM | POA: Diagnosis present

## 2022-01-11 DIAGNOSIS — O9081 Anemia of the puerperium: Secondary | ICD-10-CM | POA: Diagnosis present

## 2022-01-11 DIAGNOSIS — O99285 Endocrine, nutritional and metabolic diseases complicating the puerperium: Secondary | ICD-10-CM | POA: Diagnosis present

## 2022-01-11 DIAGNOSIS — E162 Hypoglycemia, unspecified: Secondary | ICD-10-CM | POA: Diagnosis present

## 2022-01-11 DIAGNOSIS — E876 Hypokalemia: Secondary | ICD-10-CM | POA: Diagnosis present

## 2022-01-11 DIAGNOSIS — A419 Sepsis, unspecified organism: Secondary | ICD-10-CM

## 2022-01-11 DIAGNOSIS — Z1152 Encounter for screening for COVID-19: Secondary | ICD-10-CM | POA: Diagnosis not present

## 2022-01-11 DIAGNOSIS — G9341 Metabolic encephalopathy: Secondary | ICD-10-CM | POA: Diagnosis present

## 2022-01-11 DIAGNOSIS — O85 Puerperal sepsis: Secondary | ICD-10-CM | POA: Diagnosis present

## 2022-01-11 DIAGNOSIS — N132 Hydronephrosis with renal and ureteral calculous obstruction: Secondary | ICD-10-CM | POA: Diagnosis not present

## 2022-01-11 DIAGNOSIS — N136 Pyonephrosis: Secondary | ICD-10-CM | POA: Diagnosis present

## 2022-01-11 DIAGNOSIS — N12 Tubulo-interstitial nephritis, not specified as acute or chronic: Principal | ICD-10-CM | POA: Diagnosis present

## 2022-01-11 DIAGNOSIS — Z841 Family history of disorders of kidney and ureter: Secondary | ICD-10-CM | POA: Diagnosis not present

## 2022-01-11 DIAGNOSIS — O9953 Diseases of the respiratory system complicating the puerperium: Secondary | ICD-10-CM | POA: Diagnosis present

## 2022-01-11 DIAGNOSIS — Z8613 Personal history of malaria: Secondary | ICD-10-CM | POA: Diagnosis not present

## 2022-01-11 DIAGNOSIS — J45909 Unspecified asthma, uncomplicated: Secondary | ICD-10-CM | POA: Diagnosis present

## 2022-01-11 HISTORY — PX: CYSTOSCOPY W/ URETERAL STENT PLACEMENT: SHX1429

## 2022-01-11 HISTORY — DX: Acute pyelonephritis: N10

## 2022-01-11 HISTORY — DX: Other specified postprocedural states: Z98.890

## 2022-01-11 LAB — BASIC METABOLIC PANEL
Anion gap: 12 (ref 5–15)
BUN: 9 mg/dL (ref 6–20)
CO2: 18 mmol/L — ABNORMAL LOW (ref 22–32)
Calcium: 8.2 mg/dL — ABNORMAL LOW (ref 8.9–10.3)
Chloride: 110 mmol/L (ref 98–111)
Creatinine, Ser: 0.67 mg/dL (ref 0.44–1.00)
GFR, Estimated: >60 mL/min (ref >60)
Glucose, Bld: 109 mg/dL — ABNORMAL HIGH (ref 70–99)
Potassium: 4.3 mmol/L (ref 3.5–5.1)
Sodium: 140 mmol/L (ref 135–145)

## 2022-01-11 LAB — CBC
HCT: 28.8 % — ABNORMAL LOW (ref 36.0–46.0)
Hemoglobin: 9 g/dL — ABNORMAL LOW (ref 12.0–15.0)
MCH: 28.8 pg (ref 26.0–34.0)
MCHC: 31.3 g/dL (ref 30.0–36.0)
MCV: 92.3 fL (ref 80.0–100.0)
Platelets: 214 10*3/uL (ref 150–400)
RBC: 3.12 MIL/uL — ABNORMAL LOW (ref 3.87–5.11)
RDW: 14.3 % (ref 11.5–15.5)
WBC: 11.1 10*3/uL — ABNORMAL HIGH (ref 4.0–10.5)
nRBC: 0 % (ref 0.0–0.2)

## 2022-01-11 LAB — URINALYSIS, ROUTINE W REFLEX MICROSCOPIC
Bilirubin Urine: NEGATIVE
Glucose, UA: 50 mg/dL — AB
Ketones, ur: 80 mg/dL — AB
Nitrite: NEGATIVE
Protein, ur: 30 mg/dL — AB
Specific Gravity, Urine: 1.017 (ref 1.005–1.030)
pH: 5 (ref 5.0–8.0)

## 2022-01-11 LAB — CBG MONITORING, ED
Glucose-Capillary: 41 mg/dL — CL (ref 70–99)
Glucose-Capillary: 146 mg/dL — ABNORMAL HIGH (ref 70–99)
Glucose-Capillary: 107 mg/dL — ABNORMAL HIGH (ref 70–99)

## 2022-01-11 LAB — MAGNESIUM: Magnesium: 1.6 mg/dL — ABNORMAL LOW (ref 1.7–2.4)

## 2022-01-11 LAB — RESP PANEL BY RT-PCR (FLU A&B, COVID) ARPGX2
Influenza A by PCR: NEGATIVE
Influenza B by PCR: NEGATIVE
SARS Coronavirus 2 by RT PCR: NEGATIVE

## 2022-01-11 SURGERY — CYSTOSCOPY, WITH RETROGRADE PYELOGRAM AND URETERAL STENT INSERTION
Anesthesia: General | Laterality: Right

## 2022-01-11 MED ORDER — DEXTROSE 50 % IV SOLN
1.0000 | Freq: Once | INTRAVENOUS | Status: AC
  Administered 2022-01-11: 50 mL via INTRAVENOUS

## 2022-01-11 MED ORDER — PROPOFOL 10 MG/ML IV BOLUS
INTRAVENOUS | Status: DC | PRN
  Administered 2022-01-11: 150 mg via INTRAVENOUS

## 2022-01-11 MED ORDER — MORPHINE SULFATE (PF) 2 MG/ML IV SOLN
2.0000 mg | INTRAVENOUS | Status: DC | PRN
  Administered 2022-01-11: 2 mg via INTRAVENOUS
  Filled 2022-01-11: qty 1

## 2022-01-11 MED ORDER — MORPHINE SULFATE (PF) 4 MG/ML IV SOLN
4.0000 mg | Freq: Once | INTRAVENOUS | Status: AC
  Administered 2022-01-11: 4 mg via INTRAVENOUS
  Filled 2022-01-11: qty 1

## 2022-01-11 MED ORDER — EPHEDRINE 5 MG/ML INJ
INTRAVENOUS | Status: AC
  Filled 2022-01-11: qty 5

## 2022-01-11 MED ORDER — SUCCINYLCHOLINE CHLORIDE 200 MG/10ML IV SOSY
PREFILLED_SYRINGE | INTRAVENOUS | Status: DC | PRN
  Administered 2022-01-11: 120 mg via INTRAVENOUS

## 2022-01-11 MED ORDER — SURGILUBE EX GEL
CUTANEOUS | Status: DC | PRN
  Administered 2022-01-11: 1 via TOPICAL

## 2022-01-11 MED ORDER — DEXMEDETOMIDINE HCL IN NACL 80 MCG/20ML IV SOLN
INTRAVENOUS | Status: DC | PRN
  Administered 2022-01-11: 8 ug via BUCCAL

## 2022-01-11 MED ORDER — DEXTROSE 10 % IV SOLN: INTRAVENOUS | Status: DC

## 2022-01-11 MED ORDER — DEXTROSE 50 % IV SOLN
INTRAVENOUS | Status: AC
  Filled 2022-01-11: qty 50

## 2022-01-11 MED ORDER — LACTATED RINGERS IV BOLUS
1000.0000 mL | Freq: Once | INTRAVENOUS | Status: AC
  Administered 2022-01-11: 1000 mL via INTRAVENOUS

## 2022-01-11 MED ORDER — SODIUM CHLORIDE 0.9 % IV SOLN
1.0000 g | INTRAVENOUS | Status: DC
  Administered 2022-01-11 – 2022-01-13 (×3): 1 g via INTRAVENOUS
  Filled 2022-01-11 (×3): qty 10

## 2022-01-11 MED ORDER — LIDOCAINE HCL (CARDIAC) PF 100 MG/5ML IV SOSY
PREFILLED_SYRINGE | INTRAVENOUS | Status: DC | PRN
  Administered 2022-01-11: 40 mg via INTRATRACHEAL

## 2022-01-11 MED ORDER — SUCCINYLCHOLINE CHLORIDE 200 MG/10ML IV SOSY
PREFILLED_SYRINGE | INTRAVENOUS | Status: AC
  Filled 2022-01-11: qty 10

## 2022-01-11 MED ORDER — IOHEXOL 350 MG/ML SOLN
75.0000 mL | Freq: Once | INTRAVENOUS | Status: AC | PRN
  Administered 2022-01-11: 75 mL via INTRAVENOUS

## 2022-01-11 MED ORDER — DEXAMETHASONE SODIUM PHOSPHATE 4 MG/ML IJ SOLN
INTRAMUSCULAR | Status: DC | PRN
  Administered 2022-01-11: 10 mg via INTRAVENOUS

## 2022-01-11 MED ORDER — OXYCODONE HCL 5 MG PO TABS
5.0000 mg | ORAL_TABLET | ORAL | Status: DC | PRN
  Administered 2022-01-11 – 2022-01-13 (×6): 5 mg via ORAL
  Filled 2022-01-11 (×6): qty 1

## 2022-01-11 MED ORDER — LIDOCAINE 2% (20 MG/ML) 5 ML SYRINGE
INTRAMUSCULAR | Status: AC
  Filled 2022-01-11: qty 5

## 2022-01-11 MED ORDER — ARTIFICIAL TEARS OPHTHALMIC OINT
TOPICAL_OINTMENT | OPHTHALMIC | Status: AC
  Filled 2022-01-11: qty 3.5

## 2022-01-11 MED ORDER — IBUPROFEN 400 MG PO TABS
600.0000 mg | ORAL_TABLET | Freq: Once | ORAL | Status: AC
  Administered 2022-01-11: 600 mg via ORAL
  Filled 2022-01-11: qty 1

## 2022-01-11 MED ORDER — LACTATED RINGERS IV SOLN: INTRAVENOUS | Status: DC | PRN

## 2022-01-11 MED ORDER — PHENOL 1.4 % MT LIQD
1.0000 | OROMUCOSAL | Status: DC | PRN
  Administered 2022-01-12: 1 via OROMUCOSAL
  Filled 2022-01-11: qty 177

## 2022-01-11 MED ORDER — SUGAMMADEX SODIUM 200 MG/2ML IV SOLN
INTRAVENOUS | Status: DC | PRN
  Administered 2022-01-11: 200 mg via INTRAVENOUS

## 2022-01-11 MED ORDER — FENTANYL CITRATE (PF) 250 MCG/5ML IJ SOLN
INTRAMUSCULAR | Status: AC
  Filled 2022-01-11: qty 5

## 2022-01-11 MED ORDER — PROPOFOL 10 MG/ML IV BOLUS
INTRAVENOUS | Status: AC
  Filled 2022-01-11: qty 20

## 2022-01-11 MED ORDER — WATER FOR IRRIGATION, STERILE IR SOLN
Status: DC | PRN
  Administered 2022-01-11: 1000 mL

## 2022-01-11 MED ORDER — ORAL CARE MOUTH RINSE: 15.0000 mL | OROMUCOSAL | Status: DC | PRN

## 2022-01-11 MED ORDER — ACETAMINOPHEN 500 MG PO TABS: 1000.0000 mg | ORAL_TABLET | Freq: Once | ORAL | Status: DC

## 2022-01-11 MED ORDER — ROCURONIUM BROMIDE 100 MG/10ML IV SOLN
INTRAVENOUS | Status: DC | PRN
  Administered 2022-01-11: 30 mg via INTRAVENOUS

## 2022-01-11 MED ORDER — ONDANSETRON HCL 4 MG/2ML IJ SOLN
INTRAMUSCULAR | Status: DC | PRN
  Administered 2022-01-11: 4 mg via INTRAVENOUS

## 2022-01-11 MED ORDER — IOHEXOL 300 MG/ML  SOLN
INTRAMUSCULAR | Status: DC | PRN
  Administered 2022-01-11: 10 mL

## 2022-01-11 MED ORDER — ENOXAPARIN SODIUM 40 MG/0.4ML IJ SOSY
40.0000 mg | PREFILLED_SYRINGE | INTRAMUSCULAR | Status: DC
  Administered 2022-01-11 – 2022-01-13 (×3): 40 mg via SUBCUTANEOUS
  Filled 2022-01-11 (×3): qty 0.4

## 2022-01-11 MED ORDER — ONDANSETRON HCL 4 MG/2ML IJ SOLN
INTRAMUSCULAR | Status: AC
  Filled 2022-01-11: qty 2

## 2022-01-11 MED ORDER — KETOROLAC TROMETHAMINE 30 MG/ML IJ SOLN
INTRAMUSCULAR | Status: DC | PRN
  Administered 2022-01-11: 30 mg via INTRAVENOUS

## 2022-01-11 MED ORDER — PHENYLEPHRINE 80 MCG/ML (10ML) SYRINGE FOR IV PUSH (FOR BLOOD PRESSURE SUPPORT)
PREFILLED_SYRINGE | INTRAVENOUS | Status: AC
  Filled 2022-01-11: qty 10

## 2022-01-11 MED ORDER — MORPHINE SULFATE (PF) 2 MG/ML IV SOLN
1.0000 mg | INTRAVENOUS | Status: DC | PRN
  Administered 2022-01-11 – 2022-01-13 (×6): 1 mg via INTRAVENOUS
  Filled 2022-01-11 (×6): qty 1

## 2022-01-11 MED ORDER — ROCURONIUM BROMIDE 10 MG/ML (PF) SYRINGE
PREFILLED_SYRINGE | INTRAVENOUS | Status: AC
  Filled 2022-01-11: qty 10

## 2022-01-11 MED ORDER — MAGNESIUM SULFATE 2 GM/50ML IV SOLN
2.0000 g | Freq: Once | INTRAVENOUS | Status: AC
  Administered 2022-01-11: 2 g via INTRAVENOUS
  Filled 2022-01-11: qty 50

## 2022-01-11 MED ORDER — CHLORHEXIDINE GLUCONATE CLOTH 2 % EX PADS
6.0000 | MEDICATED_PAD | Freq: Every day | CUTANEOUS | Status: DC
  Administered 2022-01-11: 6 via TOPICAL

## 2022-01-11 SURGICAL SUPPLY — 25 items
BAG URINE DRAIN 2000ML AR STRL (UROLOGICAL SUPPLIES) ×1 IMPLANT
BAG URO CATCHER STRL LF (MISCELLANEOUS) ×1 IMPLANT
BASKET LASER NITINOL 1.9FR (BASKET) IMPLANT
CATH FOLEY 2WAY SLVR  5CC 16FR (CATHETERS)
CATH FOLEY 2WAY SLVR 5CC 16FR (CATHETERS) IMPLANT
CATH INTERMIT  6FR 70CM (CATHETERS) ×1 IMPLANT
GLOVE BIO SURGEON STRL SZ7.5 (GLOVE) ×1 IMPLANT
GOWN STRL REUS W/ TWL LRG LVL3 (GOWN DISPOSABLE) ×1 IMPLANT
GOWN STRL REUS W/ TWL XL LVL3 (GOWN DISPOSABLE) ×1 IMPLANT
GOWN STRL REUS W/TWL LRG LVL3 (GOWN DISPOSABLE) ×1
GOWN STRL REUS W/TWL XL LVL3 (GOWN DISPOSABLE) ×1
GUIDEWIRE ANG ZIPWIRE 038X150 (WIRE) IMPLANT
GUIDEWIRE STR DUAL SENSOR (WIRE) ×1 IMPLANT
KIT TURNOVER KIT B (KITS) ×1 IMPLANT
MANIFOLD NEPTUNE II (INSTRUMENTS) ×1 IMPLANT
NS IRRIG 1000ML POUR BTL (IV SOLUTION) ×1 IMPLANT
PACK CYSTO (CUSTOM PROCEDURE TRAY) ×1 IMPLANT
STENT URET 6FRX24 CONTOUR (STENTS) IMPLANT
STENT URET 6FRX26 CONTOUR (STENTS) IMPLANT
SYPHON OMNI JUG (MISCELLANEOUS) ×1 IMPLANT
TOWEL GREEN STERILE FF (TOWEL DISPOSABLE) ×1 IMPLANT
TRAY FOLEY W/BAG SLVR 16FR (SET/KITS/TRAYS/PACK) ×1
TRAY FOLEY W/BAG SLVR 16FR ST (SET/KITS/TRAYS/PACK) IMPLANT
TUBE CONNECTING 12X1/4 (SUCTIONS) IMPLANT
WATER STERILE IRR 3000ML UROMA (IV SOLUTION) ×1 IMPLANT

## 2022-01-11 NOTE — ED Provider Notes (Signed)
  Physical Exam  BP (!) 121/94   Pulse (!) 108   Temp (!) 102.1 F (38.9 C) (Oral)   Resp (!) 31   Ht 5\' 2"  (1.575 m)   Wt 75.3 kg   LMP 03/30/2021 (Exact Date)   SpO2 100%   BMI 30.36 kg/m   Physical Exam HENT:     Mouth/Throat:     Mouth: Mucous membranes are dry.  Cardiovascular:     Rate and Rhythm: Tachycardia present.  Pulmonary:     Comments: tachypnea Musculoskeletal:        General: Normal range of motion.  Skin:    General: Skin is warm and dry.  Neurological:     General: No focal deficit present.     Procedures  .Critical Care  Performed by: 04/01/2021, MD Authorized by: Marily Memos, MD   Critical care provider statement:    Critical care time (minutes):  30   Critical care was necessary to treat or prevent imminent or life-threatening deterioration of the following conditions:  Sepsis and dehydration   Critical care was time spent personally by me on the following activities:  Development of treatment plan with patient or surrogate, discussions with consultants, evaluation of patient's response to treatment, examination of patient, ordering and review of laboratory studies, ordering and review of radiographic studies, ordering and performing treatments and interventions, pulse oximetry, re-evaluation of patient's condition and review of old charts   ED Course / MDM   Clinical Course as of 01/11/22 0812  Sat Jan 11, 2022  0219 I was notified that patient was hypoglycemic to 41.  RN instructed to give 1 amp of D50.  I reassessed the patient and found patient to be febrile to 102.1.  We will give Motrin and another liter of fluid.  She is not hypotensive.  She is tachycardic, but I suspect that this is fever induced.  She is much more alert and responding to questions appropriately.  Still waiting on hospitalist consult. [RB]  Jan 13, 2022 Case discussed with Dr. [id number], who recommends urology consult for obstructing stone.   [RB]  Loney Loh Case discussed with Dr.  [id number], urology, who plans to take patient to OR due to obstructive uropathy and sepsis. [RB]    Clinical Course User Index [RB] Alvester Morin, PA-C   Medical Decision Making Amount and/or Complexity of Data Reviewed Labs: ordered. Radiology: ordered. ECG/medicine tests: ordered.  Risk OTC drugs. Prescription drug management. Decision regarding hospitalization.   Signout received from previous team with fever, body aches of unknown source.  She is 1 week status post epidural and vaginal delivery.  Was basically unresponsive on arrival thought to be likely related to hypoglycemia which has been corrected and mental status was slowly improving.  She was pending urinalysis, CT scan, COVID/flu for further management.  OB had already been consulted and felt that the cause of fever needed to be worked up in the ER prior to admission to them if needed.  Urinalysis looks infected is nitrite negative but has all the other signs especially with the pyelonephritis found on her CT scan is likely cause of her symptoms.  Has obstructing stone as well.  Plan for urology consult medicine admission.  Became hypoglycemic again will initiate a D10 drip.  Her fever also went back up concerning for sepsis so urology consulted emergently for further management of likely infected stone and pyelonephritis.       Vernel Langenderfer, Roxy Horseman, MD 01/11/22 [phone]

## 2022-01-11 NOTE — ED Notes (Signed)
PA notified of recheck CBG

## 2022-01-11 NOTE — Anesthesia Postprocedure Evaluation (Signed)
Anesthesia Post Note  Patient: Taylor Ramsey  Procedure(s) Performed: CYSTOSCOPY WITH RETROGRADE PYELOGRAM/URETERAL STENT PLACEMENT (Right)     Patient location during evaluation: PACU Anesthesia Type: General Level of consciousness: awake and alert Pain management: pain level controlled Vital Signs Assessment: post-procedure vital signs reviewed and stable Respiratory status: spontaneous breathing, nonlabored ventilation, respiratory function stable and patient connected to nasal cannula oxygen Cardiovascular status: blood pressure returned to baseline, stable and tachycardic Postop Assessment: no apparent nausea or vomiting Anesthetic complications: no   No notable events documented.  Last Vitals:  Vitals:   01/11/22 0600 01/11/22 0629  BP: 117/76 115/72  Pulse: (!) 119 (!) 116  Resp: 20 20  Temp: 37.9 C 37.4 C  SpO2: 97% 93%    Last Pain:  Vitals:   01/11/22 0629  TempSrc: Oral  PainSc: 6                  Collene Schlichter

## 2022-01-11 NOTE — H&P (View-Only) (Signed)
H&P Physician requesting consult: Marily Memos  Chief Complaint: right ureteral stone, sepsis  History of Present Illness: 21 YO F presenting with altered mental status.  She is 1 week postpartum.  Patient was complaining about generalized body aches today and subsequently became nonverbal responsive and therefore was brought to the emergency department.  She presented obtunded with altered mental status.  She was febrile.  She underwent extensive work-up and ultimately a CT scan of the abdomen pelvis was performed that revealed findings consistent with bilateral pyelonephritis.  She also had an obstructing punctate right ureterovesicular junction calculus with upstream hydronephrosis.  Urinalysis with trace leukocyte, negative nitrite, many bacteria, 21-50 WBC.  She admits to abdominal pain and generalized aches.  Is now more verbal but still significant fatigue.  Past Medical History:  Diagnosis Date   Asthma    Malaria 2018   Vitamin D deficiency    Past Surgical History:  Procedure Laterality Date   NO PAST SURGERIES      Home Medications:  (Not in a hospital admission)  Allergies: No Known Allergies  Family History  Problem Relation Age of Onset   Hypertension Father    Kidney disease Father    Social History:  reports that she has never smoked. She has never used smokeless tobacco. She reports that she does not drink alcohol and does not use drugs.  ROS: A complete review of systems was performed.  All systems are negative except for pertinent findings as noted. ROS   Physical Exam:  Vital signs in last 24 hours: Temp:  [98.5 F (36.9 C)-102.1 F (38.9 C)] 102.1 F (38.9 C) (11/11 0230) Pulse Rate:  [76-133] 133 (11/11 0330) Resp:  [17-35] 30 (11/11 0330) BP: (106-139)/(61-99) 111/62 (11/11 0330) SpO2:  [96 %-100 %] 97 % (11/11 0330) Weight:  [75.3 kg] 75.3 kg (11/10 2115) General: Patient in moderate distress HEENT: Normocephalic, atraumatic Neck: No JVD or  lymphadenopathy Cardiovascular: Sinus tachycardia on the monitor, normotensive Lungs: Regular rate and effort Abdomen: Soft, nontender, nondistended, no abdominal masses Back: No CVA tenderness Extremities: No edema  Laboratory Data:  Results for orders placed or performed during the hospital encounter of 01/10/22 (from the past 24 hour(s))  CBG monitoring, ED     Status: Abnormal   Collection Time: 01/10/22  9:30 PM  Result Value Ref Range   Glucose-Capillary 30 (LL) 70 - 99 mg/dL   Comment 1 Document in Chart   CBG monitoring, ED     Status: None   Collection Time: 01/10/22 10:23 PM  Result Value Ref Range   Glucose-Capillary 94 70 - 99 mg/dL  Lactic acid, plasma     Status: None   Collection Time: 01/10/22 10:27 PM  Result Value Ref Range   Lactic Acid, Venous 1.8 0.5 - 1.9 mmol/L  Comprehensive metabolic panel     Status: Abnormal   Collection Time: 01/10/22 10:27 PM  Result Value Ref Range   Sodium 138 135 - 145 mmol/L   Potassium 3.3 (L) 3.5 - 5.1 mmol/L   Chloride 107 98 - 111 mmol/L   CO2 19 (L) 22 - 32 mmol/L   Glucose, Bld 197 (H) 70 - 99 mg/dL   BUN 14 6 - 20 mg/dL   Creatinine, Ser 2.44 0.44 - 1.00 mg/dL   Calcium 8.5 (L) 8.9 - 10.3 mg/dL   Total Protein 6.6 6.5 - 8.1 g/dL   Albumin 2.9 (L) 3.5 - 5.0 g/dL   AST 18 15 - 41 U/L  ALT 13 0 - 44 U/L   Alkaline Phosphatase 96 38 - 126 U/L   Total Bilirubin 0.7 0.3 - 1.2 mg/dL   GFR, Estimated >16 >10 mL/min   Anion gap 12 5 - 15  CBC with Differential     Status: Abnormal   Collection Time: 01/10/22 10:27 PM  Result Value Ref Range   WBC 11.3 (H) 4.0 - 10.5 K/uL   RBC 3.49 (L) 3.87 - 5.11 MIL/uL   Hemoglobin 10.3 (L) 12.0 - 15.0 g/dL   HCT 96.0 (L) 45.4 - 09.8 %   MCV 92.0 80.0 - 100.0 fL   MCH 29.5 26.0 - 34.0 pg   MCHC 32.1 30.0 - 36.0 g/dL   RDW 11.9 14.7 - 82.9 %   Platelets 232 150 - 400 K/uL   nRBC 0.0 0.0 - 0.2 %   Neutrophils Relative % 91 %   Neutro Abs 10.2 (H) 1.7 - 7.7 K/uL   Lymphocytes  Relative 6 %   Lymphs Abs 0.7 0.7 - 4.0 K/uL   Monocytes Relative 2 %   Monocytes Absolute 0.2 0.1 - 1.0 K/uL   Eosinophils Relative 0 %   Eosinophils Absolute 0.0 0.0 - 0.5 K/uL   Basophils Relative 0 %   Basophils Absolute 0.0 0.0 - 0.1 K/uL   Immature Granulocytes 1 %   Abs Immature Granulocytes 0.15 (H) 0.00 - 0.07 K/uL  Protime-INR     Status: None   Collection Time: 01/10/22 10:27 PM  Result Value Ref Range   Prothrombin Time 14.5 11.4 - 15.2 seconds   INR 1.1 0.8 - 1.2  APTT     Status: None   Collection Time: 01/10/22 10:27 PM  Result Value Ref Range   aPTT 28 24 - 36 seconds  Resp Panel by RT-PCR (Flu A&B, Covid) Anterior Nasal Swab     Status: None   Collection Time: 01/10/22 10:40 PM   Specimen: Anterior Nasal Swab  Result Value Ref Range   SARS Coronavirus 2 by RT PCR NEGATIVE NEGATIVE   Influenza A by PCR NEGATIVE NEGATIVE   Influenza B by PCR NEGATIVE NEGATIVE  Urinalysis, Routine w reflex microscopic Urine, In & Out Cath     Status: Abnormal   Collection Time: 01/11/22 12:15 AM  Result Value Ref Range   Color, Urine YELLOW YELLOW   APPearance HAZY (A) CLEAR   Specific Gravity, Urine 1.017 1.005 - 1.030   pH 5.0 5.0 - 8.0   Glucose, UA 50 (A) NEGATIVE mg/dL   Hgb urine dipstick MODERATE (A) NEGATIVE   Bilirubin Urine NEGATIVE NEGATIVE   Ketones, ur 80 (A) NEGATIVE mg/dL   Protein, ur 30 (A) NEGATIVE mg/dL   Nitrite NEGATIVE NEGATIVE   Leukocytes,Ua TRACE (A) NEGATIVE   RBC / HPF 11-20 0 - 5 RBC/hpf   WBC, UA 21-50 0 - 5 WBC/hpf   Bacteria, UA MANY (A) NONE SEEN   Squamous Epithelial / LPF 0-5 0 - 5   Mucus PRESENT   CBG monitoring, ED     Status: Abnormal   Collection Time: 01/11/22  2:19 AM  Result Value Ref Range   Glucose-Capillary 41 (LL) 70 - 99 mg/dL  CBG monitoring, ED     Status: Abnormal   Collection Time: 01/11/22  2:52 AM  Result Value Ref Range   Glucose-Capillary 146 (H) 70 - 99 mg/dL  CBG monitoring, ED     Status: Abnormal    Collection Time: 01/11/22  3:49 AM  Result Value Ref  Range   Glucose-Capillary 107 (H) 70 - 99 mg/dL   Recent Results (from the past 240 hour(s))  Resp Panel by RT-PCR (Flu A&B, Covid) Anterior Nasal Swab     Status: None   Collection Time: 01/10/22 10:40 PM   Specimen: Anterior Nasal Swab  Result Value Ref Range Status   SARS Coronavirus 2 by RT PCR NEGATIVE NEGATIVE Final    Comment: (NOTE) SARS-CoV-2 target nucleic acids are NOT DETECTED.  The SARS-CoV-2 RNA is generally detectable in upper respiratory specimens during the acute phase of infection. The lowest concentration of SARS-CoV-2 viral copies this assay can detect is 138 copies/mL. A negative result does not preclude SARS-Cov-2 infection and should not be used as the sole basis for treatment or other patient management decisions. A negative result may occur with  improper specimen collection/handling, submission of specimen other than nasopharyngeal swab, presence of viral mutation(s) within the areas targeted by this assay, and inadequate number of viral copies(<138 copies/mL). A negative result must be combined with clinical observations, patient history, and epidemiological information. The expected result is Negative.  Fact Sheet for Patients:  BloggerCourse.com  Fact Sheet for Healthcare Providers:  SeriousBroker.it  This test is no t yet approved or cleared by the [place] FDA and  has been authorized for detection and/or diagnosis of SARS-CoV-2 by FDA under an Emergency Use Authorization (EUA). This EUA will remain  in effect (meaning this test can be used) for the duration of the COVID-19 declaration under Section 564(b)(1) of the Act, 21 U.S.C.section 360bbb-3(b)(1), unless the authorization is terminated  or revoked sooner.       Influenza A by PCR NEGATIVE NEGATIVE Final   Influenza B by PCR NEGATIVE NEGATIVE Final    Comment: (NOTE) The Xpert  Xpress SARS-CoV-2/FLU/RSV plus assay is intended as an aid in the diagnosis of influenza from Nasopharyngeal swab specimens and should not be used as a sole basis for treatment. Nasal washings and aspirates are unacceptable for Xpert Xpress SARS-CoV-2/FLU/RSV testing.  Fact Sheet for Patients: BloggerCourse.com  Fact Sheet for Healthcare Providers: SeriousBroker.it  This test is not yet approved or cleared by the [place] FDA and has been authorized for detection and/or diagnosis of SARS-CoV-2 by FDA under an Emergency Use Authorization (EUA). This EUA will remain in effect (meaning this test can be used) for the duration of the COVID-19 declaration under Section 564(b)(1) of the Act, 21 U.S.C. section 360bbb-3(b)(1), unless the authorization is terminated or revoked.  Performed at [facility], [address], [place], [place] [address]    Creatinine: Recent Labs    01/10/22 2227  CREATININE 0.92   CT scan personally reviewed and is detailed in history of present illness  Impression/Assessment:  Right ureteral calculus Right ureteral obstruction secondary to calculus Sepsis secondary to urinary tract infection/pyelonephritis  Plan:  She has received ceftriaxone.  Continue broad-spectrum antibiotics.  We will proceed emergently to the operating room for cystoscopy with right retrograde pyelogram, right ureteral stent placement.  Risk benefits discussed including but not limited to bleeding, infection, injury to surrounding structures, need for additional procedures, worsening clinical status, clinical decline.  Patient is critically ill and condition is life-threatening.  Ray Church, III 01/11/2022, 4:03 AM

## 2022-01-11 NOTE — Anesthesia Procedure Notes (Signed)
Procedure Name: Intubation Date/Time: 01/11/2022 4:43 AM  Performed by: Claudina Lick, CRNAPre-anesthesia Checklist: Patient identified, Emergency Drugs available, Suction available and Patient being monitored Patient Re-evaluated:Patient Re-evaluated prior to induction Oxygen Delivery Method: Circle system utilized Preoxygenation: Pre-oxygenation with 100% oxygen Induction Type: IV induction, Rapid sequence and Cricoid Pressure applied Laryngoscope Size: Miller and 2 Grade View: Grade I Tube type: Oral Tube size: 7.0 mm Number of attempts: 1 Airway Equipment and Method: Stylet Placement Confirmation: ETT inserted through vocal cords under direct vision, positive ETCO2 and breath sounds checked- equal and bilateral Secured at: 20 cm Tube secured with: Tape Dental Injury: Teeth and Oropharynx as per pre-operative assessment

## 2022-01-11 NOTE — Transfer of Care (Signed)
Immediate Anesthesia Transfer of Care Note  Patient: Taylor Ramsey  Procedure(s) Performed: CYSTOSCOPY WITH RETROGRADE PYELOGRAM/URETERAL STENT PLACEMENT (Right)  Patient Location: PACU  Anesthesia Type:General  Level of Consciousness: drowsy  Airway & Oxygen Therapy: Patient Spontanous Breathing and Patient connected to face mask oxygen  Post-op Assessment: Report given to RN and Post -op Vital signs reviewed and stable  Post vital signs: Reviewed and stable  Last Vitals:  Vitals Value Taken Time  BP    Temp    Pulse    Resp    SpO2      Last Pain:  Vitals:   01/11/22 0349  TempSrc: Oral  PainSc:          Complications: No notable events documented.

## 2022-01-11 NOTE — ED Notes (Signed)
Pt visitor notified staff that pt was unable to breath. This nurse and erica RN at the beside. O2 at 100. CbG 41. Provider made aware.

## 2022-01-11 NOTE — ED Notes (Signed)
Pt taken to CT.

## 2022-01-11 NOTE — ED Notes (Signed)
Dr Alvester Morin notified of pt repeat temp - no meds ordered at this time

## 2022-01-11 NOTE — H&P (Signed)
History and Physical    Taylor Ramsey QVZ:[id number] DOB: December 09, 2000 DOA: 01/10/2022  PCP: [facility] Patient coming from: home  I have personally briefly reviewed patient's old medical records in [facility]  Chief Complaint: altered mental status  HPI: Taylor Ramsey is a 21 y.o. female with medical history significant of asthma, 1 week postpartum, brought to the hospital due to confusion,   ED Course: She was febrile , tachycardic , tachypneic on presentation  she underwent extensive work-up and ultimately a CT scan of the abdomen pelvis was performed that revealed findings consistent with bilateral pyelonephritis, right ureteral stone, she has persistent hypoglycemia in the ED required D10 infusion, urology consulted underwent urgent stent placement overnight, she is admitted to hospitalist service, this morning she appear weak but oriented x3   Review of Systems: As per HPI otherwise all other systems reviewed and are negative.   Past Medical History:  Diagnosis Date   Asthma    Malaria 2018   Vitamin D deficiency     Past Surgical History:  Procedure Laterality Date   NO PAST SURGERIES      Social History  reports that she has never smoked. She has never used smokeless tobacco. She reports that she does not drink alcohol and does not use drugs.  No Known Allergies  Family History  Problem Relation Age of Onset   Hypertension Father    Kidney disease Father     Prior to Admission medications   Medication Sig Start Date End Date Taking? Authorizing Provider  ibuprofen (ADVIL) 600 MG tablet Take 1 tablet (600 mg total) by mouth every 6 (six) hours. 01/04/22  Yes Roma Schanz, CNM  acetaminophen (TYLENOL) 325 MG tablet Take 325 mg by mouth as needed for headache. Patient not taking: Reported on 01/10/2022    [provider]  albuterol (VENTOLIN HFA) 108 (90 Base) MCG/ACT inhaler Inhale 2 puffs into the lungs every 4 (four)  hours as needed for wheezing or shortness of breath. Dispense with aerochamber Patient not taking: Reported on 09/30/2021 12/30/20   Domenick Gong, MD  Prenatal Vit-Fe Fumarate-FA (MULTIVITAMIN-PRENATAL) 27-0.8 MG TABS tablet Take 1 tablet by mouth daily at 12 noon. Patient not taking: Reported on 01/10/2022    [provider]    Physical Exam: Vitals:   01/11/22 0545 01/11/22 0600 01/11/22 0629 01/11/22 0636  BP: 110/70 117/76 115/72   Pulse: (!) 115 (!) 119 (!) 116   Resp: (!) 8 20 20    Temp:  100.3 F (37.9 C) 99.3 F (37.4 C)   TempSrc:   Oral   SpO2: 100% 97% 93%   Weight:    75.3 kg  Height:    5\' 2"  (1.575 m)    Constitutional: Weak, AAOx3 Eyes: PERRL, lids and conjunctivae normal ENMT: Mucous membranes are moist.  Respiratory: clear to auscultation bilaterally, no wheezing, no crackles. Normal respiratory effort. No accessory muscle use.  Cardiovascular: Regular rate and rhythm,  No extremity edema. 2+ pedal pulses. No carotid bruits.  Abdomen: no tenderness, not distended, Bowel sounds positive.  Musculoskeletal: no clubbing / cyanosis. No joint deformity upper and lower extremities. Good ROM, no contractures. Normal muscle tone.  Skin: no rashes, lesions, ulcers. No induration Neurologic: CN 2-12 grossly intact. Sensation intact, Strength 5/5 in all 4.  Psychiatric: Normal judgment and insight. Alert and oriented x 3. Normal mood.    Labs on Admission: I have personally reviewed following labs and imaging studies  CBC:  Recent Labs  Lab 01/10/22 2227  WBC 11.3*  NEUTROABS 10.2*  HGB 10.3*  HCT 32.1*  MCV 92.0  PLT 232    Basic Metabolic Panel: Recent Labs  Lab 01/10/22 2227  NA 138  K 3.3*  CL 107  CO2 19*  GLUCOSE 197*  BUN 14  CREATININE 0.92  CALCIUM 8.5*    GFR: Estimated Creatinine Clearance: 93.5 mL/min (by C-G formula based on SCr of 0.92 mg/dL).  Liver Function Tests: Recent Labs  Lab 01/10/22 2227  AST 18  ALT 13   ALKPHOS 96  BILITOT 0.7  PROT 6.6  ALBUMIN 2.9*    Urine analysis:    Component Value Date/Time   COLORURINE YELLOW 01/11/2022 0015   APPEARANCEUR HAZY (A) 01/11/2022 0015   LABSPEC 1.017 01/11/2022 0015   PHURINE 5.0 01/11/2022 0015   GLUCOSEU 50 (A) 01/11/2022 0015   HGBUR MODERATE (A) 01/11/2022 0015   BILIRUBINUR NEGATIVE 01/11/2022 0015   KETONESUR 80 (A) 01/11/2022 0015   PROTEINUR 30 (A) 01/11/2022 0015   UROBILINOGEN 0.2 09/10/2013 2247   NITRITE NEGATIVE 01/11/2022 0015   LEUKOCYTESUR TRACE (A) 01/11/2022 0015    Radiological Exams on Admission: DG Abd 1 View  Result Date: 01/11/2022 CLINICAL DATA:  21 year old female undergoing cystoscopy and retrograde pyelogram for right side obstructive uropathy. EXAM: ABDOMEN - 1 VIEW COMPARISON:  CT Abdomen and Pelvis 0044 hours today. FINDINGS: Four intraoperative fluoroscopic spot views of the right abdomen and pelvis. Cystoscope in place. These images demonstrate contrast injection to the right ureter and renal collecting system with subsequent double-J right ureteral stent placement. Proximal and distal pigtail appear normally located. Fluoroscopy time: 0 minutes 24 seconds.  5.1 mGy IMPRESSION: Double-J right ureteral stent placed with no adverse features. Electronically Signed   By: Odessa Fleming M.D.   On: 01/11/2022 05:58   DG C-Arm 1-60 Min-No Report  Result Date: 01/11/2022 Fluoroscopy was utilized by the requesting physician.  No radiographic interpretation.   CT ABDOMEN PELVIS W CONTRAST  Result Date: 01/11/2022 CLINICAL DATA:  Sepsis EXAM: CT ABDOMEN AND PELVIS WITH CONTRAST TECHNIQUE: Multidetector CT imaging of the abdomen and pelvis was performed using the standard protocol following bolus administration of intravenous contrast. RADIATION DOSE REDUCTION: This exam was performed according to the departmental dose-optimization program which includes automated exposure control, adjustment of the mA and/or kV according to  patient size and/or use of iterative reconstruction technique. CONTRAST:  4mL OMNIPAQUE IOHEXOL 350 MG/ML SOLN COMPARISON:  None Available. FINDINGS: Lower chest: Developing left lower lobe airspace opacity. Hepatobiliary: No focal liver abnormality. No gallstones, gallbladder wall thickening, or pericholecystic fluid. No biliary dilatation. Pancreas: No focal lesion. Normal pancreatic contour. No surrounding inflammatory changes. No main pancreatic ductal dilatation. Spleen: Normal in size without focal abnormality. Adrenals/Urinary Tract: No adrenal nodule bilaterally. Striated nephrograms bilaterally. Mild right hydronephrosis. No definite left hydronephrosis. Punctate right nephrolithiasis. Punctate left nephrolithiasis. Punctate density in the region the right ureterovesicular junction. No left ureterolithiasis. Mild urothelial thickening of the right collecting system. The urinary bladder is unremarkable. Stomach/Bowel: Stomach is within normal limits. No evidence of bowel wall thickening or dilatation. Appendix appears normal. Vascular/Lymphatic: No abdominal aorta or iliac aneurysm. Mild atherosclerotic plaque of the aorta and its branches. No abdominal, pelvic, or inguinal lymphadenopathy. Reproductive: Slightly enlarged uterus consistent with a postpartum uterus. Otherwise uterus and bilateral adnexa are unremarkable. Other: No intraperitoneal free fluid. No intraperitoneal free gas. No organized fluid collection. Musculoskeletal: No abdominal wall hernia or abnormality. No suspicious lytic  or blastic osseous lesions. No acute displaced fracture. Multilevel degenerative changes of the spine. IMPRESSION: 1. Obstructive punctate right ureterovesicular junction stone with superimposed bilateral pyelonephritis. No abscess formation. 2. Nonobstructive bilateral punctate nephrolithiasis. 3. Postpartum uterus. 4. Developing left lower lobe airspace opacity. Finding likely represents atelectasis. Electronically  Signed   By: Tish FredericksonMorgane  Naveau M.D.   On: 01/11/2022 01:21   CT Head Wo Contrast  Result Date: 01/11/2022 CLINICAL DATA:  Altered mental status, nontraumatic (Ped 0-17y) EXAM: CT HEAD WITHOUT CONTRAST TECHNIQUE: Contiguous axial images were obtained from the base of the skull through the vertex without intravenous contrast. RADIATION DOSE REDUCTION: This exam was performed according to the departmental dose-optimization program which includes automated exposure control, adjustment of the mA and/or kV according to patient size and/or use of iterative reconstruction technique. COMPARISON:  MRI head 06/29/2021 FINDINGS: Brain: No evidence of large-territorial acute infarction. No parenchymal hemorrhage. No mass lesion. No extra-axial collection. No mass effect or midline shift. No hydrocephalus. Basilar cisterns are patent. Vascular: No hyperdense vessel. Skull: No acute fracture or focal lesion. Sinuses/Orbits: Paranasal sinuses and mastoid air cells are clear. The orbits are unremarkable. Other: None. IMPRESSION: No acute intracranial abnormality. Electronically Signed   By: Tish FredericksonMorgane  Naveau M.D.   On: 01/11/2022 01:09   CT L-SPINE NO CHARGE  Result Date: 01/11/2022 CLINICAL DATA:  Sepsis.  One week postpartum. EXAM: CT LUMBAR SPINE WITHOUT CONTRAST TECHNIQUE: Multidetector CT imaging of the lumbar spine was performed without intravenous contrast administration. Multiplanar CT image reconstructions were also generated. RADIATION DOSE REDUCTION: This exam was performed according to the departmental dose-optimization program which includes automated exposure control, adjustment of the mA and/or kV according to patient size and/or use of iterative reconstruction technique. COMPARISON:  None Available. FINDINGS: Segmentation: 5 lumbar type vertebrae. Alignment: Normal. Vertebrae: No acute fracture or focal pathologic process. Paraspinal and other soft tissues: Negative. Disc levels: Maintained. IMPRESSION: 1. No  acute displaced fracture or traumatic listhesis of the lumbar spine. 2. Please see separately dictated CT head, abdomen, pelvis 01/11/2022. Electronically Signed   By: Tish FredericksonMorgane  Naveau M.D.   On: 01/11/2022 01:02   DG Chest Port 1 View  Result Date: 01/10/2022 CLINICAL DATA:  Postpartum pain EXAM: PORTABLE CHEST 1 VIEW COMPARISON:  None Available. FINDINGS: Low lung volumes. Cardiac size upper normal. Central bronchovascular crowding. No acute airspace disease, pleural effusion or pneumothorax IMPRESSION: Low lung volumes with central bronchovascular crowding. No acute airspace disease. Electronically Signed   By: Jasmine PangKim  Fujinaga M.D.   On: 01/10/2022 23:04     Assessment/Plan Principal Problem:   Status post surgery    Pyelonephritis /bilateral hydronephrosis /right ureteral stone/ sepsis/ acute metabolic encephalopathy, POA S/p Urgent stent placement, appreciate urology input Blood culture no growth, urine culture in process Continue Rocephin  Hypoglycemia Likely due to sepsis and poor oral intake Initially received D10 infusion Improved, encourage oral intake  Hypomagnesemia, replace mag, recheck in morning    DVT prophylaxis: lovenox   Code Status:    Family Communication:  Significant other at bedside   Patient is from: home   Anticipated DC to: home   Anticipated DC date: 24-48hrs, pending urine culture result    Consults called:  Urology Admission status:  Inpatient  Severity of Illness:   The appropriate patient status for this patient is INPATIENT due to history and comorbidities, severity of illness, required intensity of service to ensure the patient's safety and to avoid risk of adverse events/further clinical deterioration.  Severity of illness/comorbidities:  Sepsis, bilateral hydronephrosis, pyelonephritis, hypoglycemia, hypomagnesemia, encephalopathy Intensity of service: tests, high frequency of surveillance, interventions It is not anticipated that the  patient will be medically stable for discharge from the hospital within 2 midnights of admission.    Voice Recognition Reubin Milan dictation system was used to create this note, attempts have been made to correct errors. Please contact the author with questions and/or clarifications.  Albertine Grates MD PhD FACP [facility]  How to contact the [facility] Attending or Consulting provider 7A - 7P or covering provider during after hours 7P -7A, for this patient?   Check the care team in [facility] and look for a) attending/consulting [facility] provider listed and b) the [facility] team listed Log into [email or web address] and use [facility]'s universal password to access. If you do not have the password, please contact the hospital operator. Locate the [facility] provider you are looking for under [facility] and page to a number that you can be directly reached. If you still have difficulty reaching the provider, please page the [facility] (Director on Call) for the Hospitalists listed on amion for assistance.  01/11/2022, 7:25 AM

## 2022-01-11 NOTE — Consult Note (Addendum)
H&P Physician requesting consult: Jason Mesner  Chief Complaint: right ureteral stone, sepsis  History of Present Illness: 21 YO F presenting with altered mental status.  She is 1 week postpartum.  Patient was complaining about generalized body aches today and subsequently became nonverbal responsive and therefore was brought to the emergency department.  She presented obtunded with altered mental status.  She was febrile.  She underwent extensive work-up and ultimately a CT scan of the abdomen pelvis was performed that revealed findings consistent with bilateral pyelonephritis.  She also had an obstructing punctate right ureterovesicular junction calculus with upstream hydronephrosis.  Urinalysis with trace leukocyte, negative nitrite, many bacteria, 21-50 WBC.  She admits to abdominal pain and generalized aches.  Is now more verbal but still significant fatigue.  Past Medical History:  Diagnosis Date   Asthma    Malaria 2018   Vitamin D deficiency    Past Surgical History:  Procedure Laterality Date   NO PAST SURGERIES      Home Medications:  (Not in a hospital admission)  Allergies: No Known Allergies  Family History  Problem Relation Age of Onset   Hypertension Father    Kidney disease Father    Social History:  reports that she has never smoked. She has never used smokeless tobacco. She reports that she does not drink alcohol and does not use drugs.  ROS: A complete review of systems was performed.  All systems are negative except for pertinent findings as noted. ROS   Physical Exam:  Vital signs in last 24 hours: Temp:  [98.5 F (36.9 C)-102.1 F (38.9 C)] 102.1 F (38.9 C) (11/11 0230) Pulse Rate:  [76-133] 133 (11/11 0330) Resp:  [17-35] 30 (11/11 0330) BP: (106-139)/(61-99) 111/62 (11/11 0330) SpO2:  [96 %-100 %] 97 % (11/11 0330) Weight:  [75.3 kg] 75.3 kg (11/10 2115) General: Patient in moderate distress HEENT: Normocephalic, atraumatic Neck: No JVD or  lymphadenopathy Cardiovascular: Sinus tachycardia on the monitor, normotensive Lungs: Regular rate and effort Abdomen: Soft, nontender, nondistended, no abdominal masses Back: No CVA tenderness Extremities: No edema  Laboratory Data:  Results for orders placed or performed during the hospital encounter of 01/10/22 (from the past 24 hour(s))  CBG monitoring, ED     Status: Abnormal   Collection Time: 01/10/22  9:30 PM  Result Value Ref Range   Glucose-Capillary 30 (LL) 70 - 99 mg/dL   Comment 1 Document in Chart   CBG monitoring, ED     Status: None   Collection Time: 01/10/22 10:23 PM  Result Value Ref Range   Glucose-Capillary 94 70 - 99 mg/dL  Lactic acid, plasma     Status: None   Collection Time: 01/10/22 10:27 PM  Result Value Ref Range   Lactic Acid, Venous 1.8 0.5 - 1.9 mmol/L  Comprehensive metabolic panel     Status: Abnormal   Collection Time: 01/10/22 10:27 PM  Result Value Ref Range   Sodium 138 135 - 145 mmol/L   Potassium 3.3 (L) 3.5 - 5.1 mmol/L   Chloride 107 98 - 111 mmol/L   CO2 19 (L) 22 - 32 mmol/L   Glucose, Bld 197 (H) 70 - 99 mg/dL   BUN 14 6 - 20 mg/dL   Creatinine, Ser 0.92 0.44 - 1.00 mg/dL   Calcium 8.5 (L) 8.9 - 10.3 mg/dL   Total Protein 6.6 6.5 - 8.1 g/dL   Albumin 2.9 (L) 3.5 - 5.0 g/dL   AST 18 15 - 41 U/L     ALT 13 0 - 44 U/L   Alkaline Phosphatase 96 38 - 126 U/L   Total Bilirubin 0.7 0.3 - 1.2 mg/dL   GFR, Estimated >60 >60 mL/min   Anion gap 12 5 - 15  CBC with Differential     Status: Abnormal   Collection Time: 01/10/22 10:27 PM  Result Value Ref Range   WBC 11.3 (H) 4.0 - 10.5 K/uL   RBC 3.49 (L) 3.87 - 5.11 MIL/uL   Hemoglobin 10.3 (L) 12.0 - 15.0 g/dL   HCT 32.1 (L) 36.0 - 46.0 %   MCV 92.0 80.0 - 100.0 fL   MCH 29.5 26.0 - 34.0 pg   MCHC 32.1 30.0 - 36.0 g/dL   RDW 14.0 11.5 - 15.5 %   Platelets 232 150 - 400 K/uL   nRBC 0.0 0.0 - 0.2 %   Neutrophils Relative % 91 %   Neutro Abs 10.2 (H) 1.7 - 7.7 K/uL   Lymphocytes  Relative 6 %   Lymphs Abs 0.7 0.7 - 4.0 K/uL   Monocytes Relative 2 %   Monocytes Absolute 0.2 0.1 - 1.0 K/uL   Eosinophils Relative 0 %   Eosinophils Absolute 0.0 0.0 - 0.5 K/uL   Basophils Relative 0 %   Basophils Absolute 0.0 0.0 - 0.1 K/uL   Immature Granulocytes 1 %   Abs Immature Granulocytes 0.15 (H) 0.00 - 0.07 K/uL  Protime-INR     Status: None   Collection Time: 01/10/22 10:27 PM  Result Value Ref Range   Prothrombin Time 14.5 11.4 - 15.2 seconds   INR 1.1 0.8 - 1.2  APTT     Status: None   Collection Time: 01/10/22 10:27 PM  Result Value Ref Range   aPTT 28 24 - 36 seconds  Resp Panel by RT-PCR (Flu A&B, Covid) Anterior Nasal Swab     Status: None   Collection Time: 01/10/22 10:40 PM   Specimen: Anterior Nasal Swab  Result Value Ref Range   SARS Coronavirus 2 by RT PCR NEGATIVE NEGATIVE   Influenza A by PCR NEGATIVE NEGATIVE   Influenza B by PCR NEGATIVE NEGATIVE  Urinalysis, Routine w reflex microscopic Urine, In & Out Cath     Status: Abnormal   Collection Time: 01/11/22 12:15 AM  Result Value Ref Range   Color, Urine YELLOW YELLOW   APPearance HAZY (A) CLEAR   Specific Gravity, Urine 1.017 1.005 - 1.030   pH 5.0 5.0 - 8.0   Glucose, UA 50 (A) NEGATIVE mg/dL   Hgb urine dipstick MODERATE (A) NEGATIVE   Bilirubin Urine NEGATIVE NEGATIVE   Ketones, ur 80 (A) NEGATIVE mg/dL   Protein, ur 30 (A) NEGATIVE mg/dL   Nitrite NEGATIVE NEGATIVE   Leukocytes,Ua TRACE (A) NEGATIVE   RBC / HPF 11-20 0 - 5 RBC/hpf   WBC, UA 21-50 0 - 5 WBC/hpf   Bacteria, UA MANY (A) NONE SEEN   Squamous Epithelial / LPF 0-5 0 - 5   Mucus PRESENT   CBG monitoring, ED     Status: Abnormal   Collection Time: 01/11/22  2:19 AM  Result Value Ref Range   Glucose-Capillary 41 (LL) 70 - 99 mg/dL  CBG monitoring, ED     Status: Abnormal   Collection Time: 01/11/22  2:52 AM  Result Value Ref Range   Glucose-Capillary 146 (H) 70 - 99 mg/dL  CBG monitoring, ED     Status: Abnormal    Collection Time: 01/11/22  3:49 AM  Result Value Ref   Range   Glucose-Capillary 107 (H) 70 - 99 mg/dL   Recent Results (from the past 240 hour(s))  Resp Panel by RT-PCR (Flu A&B, Covid) Anterior Nasal Swab     Status: None   Collection Time: 01/10/22 10:40 PM   Specimen: Anterior Nasal Swab  Result Value Ref Range Status   SARS Coronavirus 2 by RT PCR NEGATIVE NEGATIVE Final    Comment: (NOTE) SARS-CoV-2 target nucleic acids are NOT DETECTED.  The SARS-CoV-2 RNA is generally detectable in upper respiratory specimens during the acute phase of infection. The lowest concentration of SARS-CoV-2 viral copies this assay can detect is 138 copies/mL. A negative result does not preclude SARS-Cov-2 infection and should not be used as the sole basis for treatment or other patient management decisions. A negative result may occur with  improper specimen collection/handling, submission of specimen other than nasopharyngeal swab, presence of viral mutation(s) within the areas targeted by this assay, and inadequate number of viral copies(<138 copies/mL). A negative result must be combined with clinical observations, patient history, and epidemiological information. The expected result is Negative.  Fact Sheet for Patients:  BloggerCourse.com  Fact Sheet for Healthcare Providers:  SeriousBroker.it  This test is no t yet approved or cleared by the [place] FDA and  has been authorized for detection and/or diagnosis of SARS-CoV-2 by FDA under an Emergency Use Authorization (EUA). This EUA will remain  in effect (meaning this test can be used) for the duration of the COVID-19 declaration under Section 564(b)(1) of the Act, 21 U.S.C.section 360bbb-3(b)(1), unless the authorization is terminated  or revoked sooner.       Influenza A by PCR NEGATIVE NEGATIVE Final   Influenza B by PCR NEGATIVE NEGATIVE Final    Comment: (NOTE) The Xpert  Xpress SARS-CoV-2/FLU/RSV plus assay is intended as an aid in the diagnosis of influenza from Nasopharyngeal swab specimens and should not be used as a sole basis for treatment. Nasal washings and aspirates are unacceptable for Xpert Xpress SARS-CoV-2/FLU/RSV testing.  Fact Sheet for Patients: BloggerCourse.com  Fact Sheet for Healthcare Providers: SeriousBroker.it  This test is not yet approved or cleared by the [place] FDA and has been authorized for detection and/or diagnosis of SARS-CoV-2 by FDA under an Emergency Use Authorization (EUA). This EUA will remain in effect (meaning this test can be used) for the duration of the COVID-19 declaration under Section 564(b)(1) of the Act, 21 U.S.C. section 360bbb-3(b)(1), unless the authorization is terminated or revoked.  Performed at [facility], [address], [place], [place] [address]    Creatinine: Recent Labs    01/10/22 2227  CREATININE 0.92   CT scan personally reviewed and is detailed in history of present illness  Impression/Assessment:  Right ureteral calculus Right ureteral obstruction secondary to calculus Sepsis secondary to urinary tract infection/pyelonephritis  Plan:  She has received ceftriaxone.  Continue broad-spectrum antibiotics.  We will proceed emergently to the operating room for cystoscopy with right retrograde pyelogram, right ureteral stent placement.  Risk benefits discussed including but not limited to bleeding, infection, injury to surrounding structures, need for additional procedures, worsening clinical status, clinical decline.  Patient is critically ill and condition is life-threatening.  Ray Church, III 01/11/2022, 4:03 AM

## 2022-01-11 NOTE — Interval H&P Note (Signed)
History and Physical Interval Note:  01/11/2022 6:40 AM  Taylor Ramsey  has presented today for surgery, with the diagnosis of RIGHT URETERAL STONE AND SEPSIS.  The various methods of treatment have been discussed with the patient and family. After consideration of risks, benefits and other options for treatment, the patient has consented to  Procedure(s): CYSTOSCOPY WITH RETROGRADE PYELOGRAM/URETERAL STENT PLACEMENT (Right) as a surgical intervention.  The patient's history has been reviewed, patient examined, no change in status, stable for surgery.  I have reviewed the patient's chart and labs.  Questions were answered to the patient's satisfaction.     Ray Church, III

## 2022-01-11 NOTE — ED Notes (Signed)
Report called to CRNA. Pt being changed and transported to Short stay 36.

## 2022-01-11 NOTE — ED Notes (Addendum)
Dr Alvester Morin at the bedside - signed consent for surgery

## 2022-01-11 NOTE — ED Provider Notes (Signed)
Patient signed out to me at shift change.  Patient here with presumed sepsis of unknown source.  Pending COVID, urinalysis, and potential LP.  Patient is 1 week postpartum.  Has been confused today per the boyfriend.  Noted to have mildly elevated temperature here of 100.7.  She was started on sepsis protocol.  Prior provider spoke with OB/GYN, who recommended ruling out other sources of infection prior to LP.  Ultimately if no source was found, consider admission for endometritis.  Patient complains of generalized body aches, she states that she does not have any focal abdominal pain.  She does report urinary frequency and dysuria.  Urinalysis is consistent with infection.  CT abdomen/pelvis consistent with pyelonephritis.  Seen by and discussed with Dr. Clayborne Dana.   Physical Exam  BP (!) 121/94   Pulse (!) 122   Temp (!) 102.1 F (38.9 C) (Oral)   Resp (!) 29   Ht 5\' 2"  (1.575 m)   Wt 75.3 kg   LMP 03/30/2021 (Exact Date)   SpO2 100%   BMI 30.36 kg/m   Physical Exam Vitals and nursing note reviewed.  Constitutional:      Appearance: She is well-developed.  HENT:     Head: Normocephalic and atraumatic.  Eyes:     Conjunctiva/sclera: Conjunctivae normal.  Cardiovascular:     Rate and Rhythm: Tachycardia present.     Heart sounds: No murmur heard. Pulmonary:     Effort: Pulmonary effort is normal. No respiratory distress.  Abdominal:     General: There is no distension.     Tenderness: There is abdominal tenderness.  Musculoskeletal:     Cervical back: Neck supple.     Comments: Moves all extremities  Skin:    General: Skin is warm and dry.  Neurological:     Mental Status: She is alert and oriented to person, place, and time.     Procedures  .Critical Care  Performed by: 04/01/2021, PA-C Authorized by: Roxy Horseman, PA-C   Critical care provider statement:    Critical care time (minutes):  85   Critical care was necessary to treat or prevent imminent or  life-threatening deterioration of the following conditions:  Sepsis and endocrine crisis   Critical care was time spent personally by me on the following activities:  Development of treatment plan with patient or surrogate, discussions with consultants, evaluation of patient's response to treatment, examination of patient, ordering and review of laboratory studies, ordering and review of radiographic studies, ordering and performing treatments and interventions, pulse oximetry, re-evaluation of patient's condition and review of old charts   I assumed direction of critical care for this patient from another provider in my specialty: yes     ED Course / MDM   Clinical Course as of 01/11/22 0316  Sat Jan 11, 2022  0219 I was notified that patient was hypoglycemic to 41.  RN instructed to give 1 amp of D50.  I reassessed the patient and found patient to be febrile to 102.1.  We will give Motrin and another liter of fluid.  She is not hypotensive.  She is tachycardic, but I suspect that this is fever induced.  She is much more alert and responding to questions appropriately.  Still waiting on hospitalist consult. [RB]  Jan 13, 2022 Case discussed with Dr. [id number], who recommends urology consult for obstructing stone.   [RB]  Loney Loh Case discussed with Dr. [id number], urology, who plans to take patient to OR due to obstructive uropathy and sepsis. [RB]  Clinical Course User Index [RB] Roxy Horseman, PA-C   Medical Decision Making Amount and/or Complexity of Data Reviewed Labs: ordered. Radiology: ordered and independent interpretation performed.    Details: No large volume free air.  Agree with radiologist interpretation noting obstructive punctate right UVJ stone with bilateral pyelonephritis. ECG/medicine tests: ordered.  Risk OTC drugs. Prescription drug management. Decision regarding hospitalization.          Roxy Horseman, PA-C 01/11/22 2446    Marily Memos, MD 01/11/22 [phone]

## 2022-01-11 NOTE — Op Note (Addendum)
Operative Note  Preoperative diagnosis:  1.  Right ureteral calculus 2.  Sepsis secondary to UTI/pyelonephritis 3.  Right ureteral obstruction secondary to calculus  Post operative diagnosis: Same  Procedure(s): 1.  Cystoscopy with right retrograde pyelogram and right ureteral stent placement  Surgeon: Modena Slater, MD  Assistants: None  Anesthesia: General  Complications: None immediate  EBL: Minimal  Specimens: 1.  None  Drains/Catheters: 1.  6 X 24 double-J ureteral stent 2.  Foley catheter  Intraoperative findings: 1.  Normal urethra and bladder 2.  Right retrograde pyelogram revealed a filling defect at the level of the stone with upstream hydroureteronephrosis  Indication: 21 year old female presented with sepsis.  CT scan showed a ureterovesicular junction calculus on the right with upstream hydronephrosis.  She presents for emergent ureteral stent placement.  Description of procedure:  The patient was identified and consent was obtained.  The patient was taken to the operating room and placed in the supine position.  The patient was placed under general anesthesia.  Perioperative antibiotics were administered.  The patient was placed in dorsal lithotomy.  Patient was prepped and draped in a standard sterile fashion and a timeout was performed.  A 21 French rigid cystoscope was advanced into the urethra and into the bladder.  The right distal most portion of the ureter was cannulated with an open-ended ureteral catheter.  Retrograde pyelogram was performed with the findings noted above.  A sensor wire was then advanced up to the kidney under fluoroscopic guidance.  A 6 X 24 double-J ureteral stent was advanced up to the kidney under fluoroscopic guidance.  The wire was withdrawn and fluoroscopy confirmed good proximal placement and direct visualization showed the distal end of the stent was just within the right ureteral orifice.  I tried to use a basket to snare the stent  but ultimately used graspers which were able to grasp the very tip that was emanating from the ureteral orifice and pulled into an appropriate position and then direct visualization confirmed a good coil within the bladder on fluoroscopy again confirmed good position in the right kidney.  Scope was withdrawn and Foley catheter was placed.  This concluded the operation.  Patient tolerated procedure well and was stable postoperatively.  Plan: Continue antibiotics and resuscitation.  Okay to remove Foley catheter when she has clinically improved.  Follow-up for ureteroscopy in 2 weeks or so.

## 2022-01-11 NOTE — Anesthesia Preprocedure Evaluation (Signed)
Anesthesia Evaluation  Patient identified by MRN, date of birth, ID band Patient awake    Reviewed: Allergy & Precautions, NPO status , Patient's Chart, lab work & pertinent test results  Airway Mallampati: II  TM Distance: >3 FB Neck ROM: Full    Dental  (+) Teeth Intact, Dental Advisory Given   Pulmonary asthma    Pulmonary exam normal breath sounds clear to auscultation       Cardiovascular negative cardio ROS  Rhythm:Regular Rate:Tachycardia     Neuro/Psych negative neurological ROS     GI/Hepatic negative GI ROS, Neg liver ROS,,,  Endo/Other  negative endocrine ROS    Renal/GU RIGHT URETERAL STONE AND SEPSIS     Musculoskeletal negative musculoskeletal ROS (+)    Abdominal   Peds  Hematology  (+) Blood dyscrasia, anemia   Anesthesia Other Findings Day of surgery medications reviewed with the patient.  Reproductive/Obstetrics 1 week postpartum                             Anesthesia Physical Anesthesia Plan  ASA: 2 and emergent  Anesthesia Plan: General   Post-op Pain Management: Toradol IV (intra-op)*   Induction: Intravenous, Rapid sequence and Cricoid pressure planned  PONV Risk Score and Plan: 4 or greater and Dexamethasone, Ondansetron and Treatment may vary due to age or medical condition  Airway Management Planned: Oral ETT  Additional Equipment:   Intra-op Plan:   Post-operative Plan: Extubation in OR  Informed Consent: I have reviewed the patients History and Physical, chart, labs and discussed the procedure including the risks, benefits and alternatives for the proposed anesthesia with the patient or authorized representative who has indicated his/her understanding and acceptance.     Dental advisory given  Plan Discussed with: CRNA  Anesthesia Plan Comments:        Anesthesia Quick Evaluation

## 2022-01-11 NOTE — ED Notes (Signed)
Provider at the bedside.  

## 2022-01-12 ENCOUNTER — Encounter (HOSPITAL_COMMUNITY): Payer: Self-pay | Admitting: Urology

## 2022-01-12 DIAGNOSIS — N1330 Unspecified hydronephrosis: Secondary | ICD-10-CM | POA: Insufficient documentation

## 2022-01-12 DIAGNOSIS — Z9889 Other specified postprocedural states: Secondary | ICD-10-CM | POA: Diagnosis not present

## 2022-01-12 DIAGNOSIS — E162 Hypoglycemia, unspecified: Secondary | ICD-10-CM | POA: Insufficient documentation

## 2022-01-12 DIAGNOSIS — N201 Calculus of ureter: Secondary | ICD-10-CM | POA: Insufficient documentation

## 2022-01-12 DIAGNOSIS — G9341 Metabolic encephalopathy: Secondary | ICD-10-CM | POA: Insufficient documentation

## 2022-01-12 DIAGNOSIS — A419 Sepsis, unspecified organism: Secondary | ICD-10-CM | POA: Insufficient documentation

## 2022-01-12 DIAGNOSIS — E876 Hypokalemia: Secondary | ICD-10-CM | POA: Insufficient documentation

## 2022-01-12 HISTORY — DX: Sepsis, unspecified organism: A41.9

## 2022-01-12 HISTORY — DX: Unspecified hydronephrosis: N13.30

## 2022-01-12 HISTORY — DX: Calculus of ureter: N20.1

## 2022-01-12 HISTORY — DX: Metabolic encephalopathy: G93.41

## 2022-01-12 HISTORY — DX: Hypokalemia: E87.6

## 2022-01-12 HISTORY — DX: Hypomagnesemia: E83.42

## 2022-01-12 LAB — CBC WITH DIFFERENTIAL/PLATELET
Abs Immature Granulocytes: 0.21 10*3/uL — ABNORMAL HIGH (ref 0.00–0.07)
Basophils Absolute: 0 10*3/uL (ref 0.0–0.1)
Basophils Relative: 0 %
Eosinophils Absolute: 0 10*3/uL (ref 0.0–0.5)
Eosinophils Relative: 0 %
HCT: 27.1 % — ABNORMAL LOW (ref 36.0–46.0)
Hemoglobin: 8.5 g/dL — ABNORMAL LOW (ref 12.0–15.0)
Immature Granulocytes: 2 %
Lymphocytes Relative: 12 %
Lymphs Abs: 1.3 10*3/uL (ref 0.7–4.0)
MCH: 28.7 pg (ref 26.0–34.0)
MCHC: 31.4 g/dL (ref 30.0–36.0)
MCV: 91.6 fL (ref 80.0–100.0)
Monocytes Absolute: 0.7 10*3/uL (ref 0.1–1.0)
Monocytes Relative: 6 %
Neutro Abs: 8.5 10*3/uL — ABNORMAL HIGH (ref 1.7–7.7)
Neutrophils Relative %: 80 %
Platelets: 251 10*3/uL (ref 150–400)
RBC: 2.96 MIL/uL — ABNORMAL LOW (ref 3.87–5.11)
RDW: 14.1 % (ref 11.5–15.5)
WBC: 10.6 10*3/uL — ABNORMAL HIGH (ref 4.0–10.5)
nRBC: 0 % (ref 0.0–0.2)

## 2022-01-12 LAB — BASIC METABOLIC PANEL
Anion gap: 7 (ref 5–15)
BUN: 10 mg/dL (ref 6–20)
CO2: 20 mmol/L — ABNORMAL LOW (ref 22–32)
Calcium: 8.5 mg/dL — ABNORMAL LOW (ref 8.9–10.3)
Chloride: 112 mmol/L — ABNORMAL HIGH (ref 98–111)
Creatinine, Ser: 0.69 mg/dL (ref 0.44–1.00)
GFR, Estimated: >60 mL/min (ref >60)
Glucose, Bld: 112 mg/dL — ABNORMAL HIGH (ref 70–99)
Potassium: 4.1 mmol/L (ref 3.5–5.1)
Sodium: 139 mmol/L (ref 135–145)

## 2022-01-12 LAB — MAGNESIUM: Magnesium: 2.2 mg/dL (ref 1.7–2.4)

## 2022-01-12 LAB — HIV ANTIBODY (ROUTINE TESTING W REFLEX): HIV Screen 4th Generation wRfx: NONREACTIVE

## 2022-01-12 MED ORDER — POLYETHYLENE GLYCOL 3350 17 G PO PACK
17.0000 g | PACK | Freq: Every day | ORAL | Status: AC
  Administered 2022-01-12 – 2022-01-13 (×2): 17 g via ORAL
  Filled 2022-01-12 (×2): qty 1

## 2022-01-12 NOTE — Progress Notes (Addendum)
PROGRESS NOTE    Taylor Ramsey  DJS:[id number] DOB: September 29, 2000 DOA: 01/10/2022 PCP: [facility]     Brief Narrative:   asthma, 1 week postpartum, brought to the hospital due to confusion,   febrile , tachycardic , tachypneic on presentation , found to have Found to have sepsis secondary to complicated UTI/obstructing kidney stone, s/p urgent stent placement  Subjective:  She is feeling better this morning, reports confusion has resolved, reports oxycodone help the pain  Significant other at bedside   Assessment & Plan:  Principal Problem:   Status post surgery Active Problems:   Acute pyelonephritis   Sepsis (HCC)   Acute metabolic encephalopathy   Hypokalemia   Hypomagnesemia   Bilateral hydronephrosis   Right ureteral stone   Hypoglycemia    Pyelonephritis /bilateral hydronephrosis /right ureteral stone/ sepsis/ acute metabolic encephalopathy, POA S/p Urgent stent placement, appreciate urology input Blood culture no growth, urine culture + Enterobacter, sensitivity pending Continue Rocephin Possible discharge tomorrow on oral antibiotics pending urine culture result, Foley to be removed prior to discharge per urology note from 11/11  Hypoglycemia -Likely due to sepsis and poor oral intake, no history of diabetes -Hypoglycemic in the ED  with CBG in the 30s to 40s despite being given doses of D50 thus required  D10 drip initially -Improved, encourage oral intake   Hypokalemia /hypomagnesemia, replaced, improved  Normocytic anemia Blood tinged urine, no other source of blood loss Check iron panel, b12, start folic acid supplement, Monitor hgb    I have Reviewed nursing notes, Vitals, pain scores, I/o's, Lab results and  imaging results since pt's last encounter, details please see discussion above  I ordered the following labs:  Unresulted Labs (From admission, onward)     Start     Ordered   01/18/22 0500  Creatinine, serum   (enoxaparin (LOVENOX)    CrCl >/= 30 ml/min)  Weekly,   R     Comments: while on enoxaparin therapy    01/11/22 0830   01/13/22 0500  CBC with Differential/Platelet  Tomorrow morning,   R       Question:  Specimen collection method  Answer:  Lab=Lab collect   01/12/22 1626   01/13/22 0500  Magnesium  Tomorrow morning,   R       Question:  Specimen collection method  Answer:  Lab=Lab collect   01/12/22 1626   01/12/22 0500  Basic metabolic panel  Daily at 5am,   R      01/11/22 0827   01/10/22 2126  Blood Culture (routine x 2)  (Undifferentiated presentation (screening labs and basic nursing orders))  BLOOD CULTURE X 2,   STAT      01/10/22 2125             DVT prophylaxis: enoxaparin (LOVENOX) injection 40 mg Start: 01/11/22 0930 SCDs Start: 01/11/22 0831   Code Status:   Code Status: Full Code  Family Communication: Significant other at the bedside Disposition:   Dispo: The patient is from: Home              Anticipated d/c is to: Home              Anticipated d/c date is: Likely on 11/13, follow-up urine culture, can remove Foley prior to discharge per urology  Antimicrobials:    Anti-infectives (From admission, onward)    Start     Dose/Rate Route Frequency Ordered Stop   01/11/22 0915  cefTRIAXone (ROCEPHIN) 1  g in sodium chloride 0.9 % 100 mL IVPB        1 g 200 mL/hr over 30 Minutes Intravenous Every 24 hours 01/11/22 0826     01/10/22 2230  cefTRIAXone (ROCEPHIN) 2 g in sodium chloride 0.9 % 100 mL IVPB        2 g 200 mL/hr over 30 Minutes Intravenous  Once 01/10/22 2225 01/10/22 2326   01/10/22 2230  vancomycin (VANCOCIN) IVPB 1000 mg/200 mL premix        1,000 mg 200 mL/hr over 60 Minutes Intravenous  Once 01/10/22 2225 01/10/22 2348          Objective: Vitals:   01/11/22 1612 01/11/22 2100 01/12/22 0036 01/12/22 0539  BP: 130/87 (!) 128/93 114/80 109/78  Pulse: 70 76 62 68  Resp: 20 18 18 20   Temp: 97.6 F (36.4 C) 98 F (36.7 C) 98.2 F (36.8  C) 98.4 F (36.9 C)  TempSrc: Oral Oral Oral Oral  SpO2: 99% 100% 98% 99%  Weight:      Height:        Intake/Output Summary (Last 24 hours) at 01/12/2022 1640 Last data filed at 01/11/2022 1700 Gross per 24 hour  Intake 600 ml  Output --  Net 600 ml   Filed Weights   01/10/22 2115 01/11/22 0636  Weight: 75.3 kg 75.3 kg    Examination:  General exam: appear weak, NAD , foley in place with blood tinged urine  Respiratory system: Clear to auscultation. Respiratory effort normal. Cardiovascular system:  RRR.  Gastrointestinal system: Abdomen is nondistended, soft and nontender.  + bowel sounds . Central nervous system: Alert and oriented. No focal neurological deficits. Extremities:  no edema Skin: No rashes, lesions or ulcers Psychiatry: Judgement and insight appear normal. Mood & affect appropriate.     Data Reviewed: I have personally reviewed  labs and visualized  imaging studies since the last encounter and formulate the plan        Scheduled Meds:  Chlorhexidine Gluconate Cloth  6 each Topical Daily   enoxaparin (LOVENOX) injection  40 mg Subcutaneous Q24H   lidocaine  10 mL Intradermal Once   polyethylene glycol  17 g Oral Daily   Continuous Infusions:  cefTRIAXone (ROCEPHIN)  IV 1 g (01/12/22 0845)     LOS: 1 day     13/12/23, MD PhD FACP [facility]  Available via Epic secure chat 7am-7pm for nonurgent issues Please page for urgent issues To page the attending provider between 7A-7P or the covering provider during after hours 7P-7A, please log into the web site [email or web address] and access using universal [place] password for that web site. If you do not have the password, please call the hospital operator.    01/12/2022, 4:40 PM

## 2022-01-12 NOTE — Progress Notes (Signed)
Urology Inpatient Progress Report  Hypoglycemia [E16.2] Pyelonephritis [N12] Status post surgery [Z98.890] Sepsis, due to unspecified organism, unspecified whether acute organ dysfunction present ([place]) [A41.9]  Procedure(s): CYSTOSCOPY WITH RETROGRADE PYELOGRAM/URETERAL STENT PLACEMENT  1 Day Post-Op   Intv/Subj: No acute events overnight. Patient is without complaint.  Patient felt much improved.  Occasional right-sided flank pain consistent with renal colic/stent discomfort.  We will bit of tachycardia but otherwise afebrile with vital signs stable.  Urine culture growing Enterobacter.  Sensitivities pending.  Remains on ceftriaxone.  Principal Problem:   Status post surgery Active Problems:   Pyelonephritis  Current Facility-Administered Medications  Medication Dose Route Frequency Provider Last Rate Last Admin   cefTRIAXone (ROCEPHIN) 1 g in sodium chloride 0.9 % 100 mL IVPB  1 g Intravenous Q24H Florencia Reasons, MD 200 mL/hr at 01/12/22 0845 1 g at 01/12/22 0845   Chlorhexidine Gluconate Cloth 2 % PADS 6 each  6 each Topical Daily Florencia Reasons, MD   6 each at 01/11/22 0638   enoxaparin (LOVENOX) injection 40 mg  40 mg Subcutaneous Q24H Florencia Reasons, MD   40 mg at 01/12/22 0840   lidocaine (XYLOCAINE) 2 % (with pres) injection 200 mg  10 mL Intradermal Once Florencia Reasons, MD       morphine (PF) 2 MG/ML injection 1 mg  1 mg Intravenous Q3H PRN Florencia Reasons, MD   1 mg at 01/11/22 2359   Oral care mouth rinse  15 mL Mouth Rinse PRN Florencia Reasons, MD       oxyCODONE (Oxy IR/ROXICODONE) immediate release tablet 5 mg  5 mg Oral Q4H PRN Florencia Reasons, MD   5 mg at 01/12/22 0851   phenol (CHLORASEPTIC) mouth spray 1 spray  1 spray Mouth/Throat PRN Florencia Reasons, MD   1 spray at 01/12/22 0258   polyethylene glycol (MIRALAX / GLYCOLAX) packet 17 g  17 g Oral Daily Florencia Reasons, MD   17 g at 01/12/22 1215     Objective: Vital: Vitals:   01/11/22 1612 01/11/22 2100 01/12/22 0036 01/12/22 0539  BP: 130/87 (!) 128/93 114/80  109/78  Pulse: 70 76 62 68  Resp: 20 18 18 20   Temp: 97.6 F (36.4 C) 98 F (36.7 C) 98.2 F (36.8 C) 98.4 F (36.9 C)  TempSrc: Oral Oral Oral Oral  SpO2: 99% 100% 98% 99%  Weight:      Height:       I/Os: I/O last 3 completed shifts: In: 5599.1 [P.O.:360; I.V.:2936.9; IV Piggyback:2302.2] Out: 3050 [Urine:3050]  Physical Exam:  General: Patient is in no apparent distress Lungs: Normal respiratory effort, chest expands symmetrically. GI:   The abdomen is soft and nontender without mass. Ext: lower extremities symmetric  Lab Results: Recent Labs    01/10/22 2227 01/11/22 0839 01/12/22 0113  WBC 11.3* 11.1* 10.6*  HGB 10.3* 9.0* 8.5*  HCT 32.1* 28.8* 27.1*   Recent Labs    01/10/22 2227 01/11/22 0839 01/12/22 0113  NA 138 140 139  K 3.3* 4.3 4.1  CL 107 110 112*  CO2 19* 18* 20*  GLUCOSE 197* 109* 112*  BUN 14 9 10   CREATININE 0.92 0.67 0.69  CALCIUM 8.5* 8.2* 8.5*   Recent Labs    01/10/22 2227  INR 1.1   No results for input(s): "LABURIN" in the last 72 hours. Results for orders placed or performed during the hospital encounter of 01/10/22  Blood Culture (routine x 2)     Status: None (Preliminary result)   Collection  Time: 01/10/22 10:27 PM   Specimen: BLOOD  Result Value Ref Range Status   Specimen Description BLOOD BLOOD RIGHT ARM  Final   Special Requests   Final    BOTTLES DRAWN AEROBIC AND ANAEROBIC Blood Culture adequate volume   Culture   Final    NO GROWTH 2 DAYS Performed at [facility], [address] [place], [place] [address]    Report Status PENDING  Incomplete  Resp Panel by RT-PCR (Flu A&B, Covid) Anterior Nasal Swab     Status: None   Collection Time: 01/10/22 10:40 PM   Specimen: Anterior Nasal Swab  Result Value Ref Range Status   SARS Coronavirus 2 by RT PCR NEGATIVE NEGATIVE Final    Comment: (NOTE) SARS-CoV-2 target nucleic acids are NOT DETECTED.  The SARS-CoV-2 RNA is generally detectable in upper  respiratory specimens during the acute phase of infection. The lowest concentration of SARS-CoV-2 viral copies this assay can detect is 138 copies/mL. A negative result does not preclude SARS-Cov-2 infection and should not be used as the sole basis for treatment or other patient management decisions. A negative result may occur with  improper specimen collection/handling, submission of specimen other than nasopharyngeal swab, presence of viral mutation(s) within the areas targeted by this assay, and inadequate number of viral copies(<138 copies/mL). A negative result must be combined with clinical observations, patient history, and epidemiological information. The expected result is Negative.  Fact Sheet for Patients:  EntrepreneurPulse.com.au  Fact Sheet for Healthcare Providers:  IncredibleEmployment.be  This test is no t yet approved or cleared by the [place] FDA and  has been authorized for detection and/or diagnosis of SARS-CoV-2 by FDA under an Emergency Use Authorization (EUA). This EUA will remain  in effect (meaning this test can be used) for the duration of the COVID-19 declaration under Section 564(b)(1) of the Act, 21 U.S.C.section 360bbb-3(b)(1), unless the authorization is terminated  or revoked sooner.       Influenza A by PCR NEGATIVE NEGATIVE Final   Influenza B by PCR NEGATIVE NEGATIVE Final    Comment: (NOTE) The Xpert Xpress SARS-CoV-2/FLU/RSV plus assay is intended as an aid in the diagnosis of influenza from Nasopharyngeal swab specimens and should not be used as a sole basis for treatment. Nasal washings and aspirates are unacceptable for Xpert Xpress SARS-CoV-2/FLU/RSV testing.  Fact Sheet for Patients: EntrepreneurPulse.com.au  Fact Sheet for Healthcare Providers: IncredibleEmployment.be  This test is not yet approved or cleared by the [place] FDA and has been  authorized for detection and/or diagnosis of SARS-CoV-2 by FDA under an Emergency Use Authorization (EUA). This EUA will remain in effect (meaning this test can be used) for the duration of the COVID-19 declaration under Section 564(b)(1) of the Act, 21 U.S.C. section 360bbb-3(b)(1), unless the authorization is terminated or revoked.  Performed at [facility], [place] [address], [place], [place] [address]   Urine Culture     Status: Abnormal (Preliminary result)   Collection Time: 01/11/22 12:15 AM   Specimen: In/Out Cath Urine  Result Value Ref Range Status   Specimen Description IN/OUT CATH URINE  Final   Special Requests   Final    NONE Performed at [facility], [place] [address] [place], [place] [address]    Culture >=100,000 COLONIES/mL ENTEROBACTER AEROGENES (A)  Final   Report Status PENDING  Incomplete    Studies/Results: DG Abd 1 View  Result Date: 01/11/2022 CLINICAL DATA:  21 year old female undergoing cystoscopy and  retrograde pyelogram for right side obstructive uropathy. EXAM: ABDOMEN - 1 VIEW COMPARISON:  CT Abdomen and Pelvis 0044 hours today. FINDINGS: Four intraoperative fluoroscopic spot views of the right abdomen and pelvis. Cystoscope in place. These images demonstrate contrast injection to the right ureter and renal collecting system with subsequent double-J right ureteral stent placement. Proximal and distal pigtail appear normally located. Fluoroscopy time: 0 minutes 24 seconds.  5.1 mGy IMPRESSION: Double-J right ureteral stent placed with no adverse features. Electronically Signed   By: Odessa Fleming M.D.   On: 01/11/2022 05:58   DG C-Arm 1-60 Min-No Report  Result Date: 01/11/2022 Fluoroscopy was utilized by the requesting physician.  No radiographic interpretation.   CT ABDOMEN PELVIS W CONTRAST  Result Date: 01/11/2022 CLINICAL DATA:  Sepsis EXAM: CT ABDOMEN AND PELVIS WITH CONTRAST TECHNIQUE: Multidetector CT imaging of the abdomen and pelvis was  performed using the standard protocol following bolus administration of intravenous contrast. RADIATION DOSE REDUCTION: This exam was performed according to the departmental dose-optimization program which includes automated exposure control, adjustment of the mA and/or kV according to patient size and/or use of iterative reconstruction technique. CONTRAST:  12mL OMNIPAQUE IOHEXOL 350 MG/ML SOLN COMPARISON:  None Available. FINDINGS: Lower chest: Developing left lower lobe airspace opacity. Hepatobiliary: No focal liver abnormality. No gallstones, gallbladder wall thickening, or pericholecystic fluid. No biliary dilatation. Pancreas: No focal lesion. Normal pancreatic contour. No surrounding inflammatory changes. No main pancreatic ductal dilatation. Spleen: Normal in size without focal abnormality. Adrenals/Urinary Tract: No adrenal nodule bilaterally. Striated nephrograms bilaterally. Mild right hydronephrosis. No definite left hydronephrosis. Punctate right nephrolithiasis. Punctate left nephrolithiasis. Punctate density in the region the right ureterovesicular junction. No left ureterolithiasis. Mild urothelial thickening of the right collecting system. The urinary bladder is unremarkable. Stomach/Bowel: Stomach is within normal limits. No evidence of bowel wall thickening or dilatation. Appendix appears normal. Vascular/Lymphatic: No abdominal aorta or iliac aneurysm. Mild atherosclerotic plaque of the aorta and its branches. No abdominal, pelvic, or inguinal lymphadenopathy. Reproductive: Slightly enlarged uterus consistent with a postpartum uterus. Otherwise uterus and bilateral adnexa are unremarkable. Other: No intraperitoneal free fluid. No intraperitoneal free gas. No organized fluid collection. Musculoskeletal: No abdominal wall hernia or abnormality. No suspicious lytic or blastic osseous lesions. No acute displaced fracture. Multilevel degenerative changes of the spine. IMPRESSION: 1. Obstructive  punctate right ureterovesicular junction stone with superimposed bilateral pyelonephritis. No abscess formation. 2. Nonobstructive bilateral punctate nephrolithiasis. 3. Postpartum uterus. 4. Developing left lower lobe airspace opacity. Finding likely represents atelectasis. Electronically Signed   By: Tish Frederickson M.D.   On: 01/11/2022 01:21   CT Head Wo Contrast  Result Date: 01/11/2022 CLINICAL DATA:  Altered mental status, nontraumatic (Ped 0-17y) EXAM: CT HEAD WITHOUT CONTRAST TECHNIQUE: Contiguous axial images were obtained from the base of the skull through the vertex without intravenous contrast. RADIATION DOSE REDUCTION: This exam was performed according to the departmental dose-optimization program which includes automated exposure control, adjustment of the mA and/or kV according to patient size and/or use of iterative reconstruction technique. COMPARISON:  MRI head 06/29/2021 FINDINGS: Brain: No evidence of large-territorial acute infarction. No parenchymal hemorrhage. No mass lesion. No extra-axial collection. No mass effect or midline shift. No hydrocephalus. Basilar cisterns are patent. Vascular: No hyperdense vessel. Skull: No acute fracture or focal lesion. Sinuses/Orbits: Paranasal sinuses and mastoid air cells are clear. The orbits are unremarkable. Other: None. IMPRESSION: No acute intracranial abnormality. Electronically Signed   By: Tish Frederickson M.D.   On: 01/11/2022 01:09  CT L-SPINE NO CHARGE  Result Date: 01/11/2022 CLINICAL DATA:  Sepsis.  One week postpartum. EXAM: CT LUMBAR SPINE WITHOUT CONTRAST TECHNIQUE: Multidetector CT imaging of the lumbar spine was performed without intravenous contrast administration. Multiplanar CT image reconstructions were also generated. RADIATION DOSE REDUCTION: This exam was performed according to the departmental dose-optimization program which includes automated exposure control, adjustment of the mA and/or kV according to patient size  and/or use of iterative reconstruction technique. COMPARISON:  None Available. FINDINGS: Segmentation: 5 lumbar type vertebrae. Alignment: Normal. Vertebrae: No acute fracture or focal pathologic process. Paraspinal and other soft tissues: Negative. Disc levels: Maintained. IMPRESSION: 1. No acute displaced fracture or traumatic listhesis of the lumbar spine. 2. Please see separately dictated CT head, abdomen, pelvis 01/11/2022. Electronically Signed   By: Iven Finn M.D.   On: 01/11/2022 01:02   DG Chest Port 1 View  Result Date: 01/10/2022 CLINICAL DATA:  Postpartum pain EXAM: PORTABLE CHEST 1 VIEW COMPARISON:  None Available. FINDINGS: Low lung volumes. Cardiac size upper normal. Central bronchovascular crowding. No acute airspace disease, pleural effusion or pneumothorax IMPRESSION: Low lung volumes with central bronchovascular crowding. No acute airspace disease. Electronically Signed   By: Donavan Foil M.D.   On: 01/10/2022 23:04    Assessment: Sepsis secondary to UTI/pyelonephritis Right ureteral calculus Right ureteral obstruction secondary to calculus  Procedure(s): CYSTOSCOPY WITH RETROGRADE PYELOGRAM/URETERAL STENT PLACEMENT, 1 Day Post-Op  doing well.  Plan: Continue ceftriaxone until culture sensitivities return.  Hopefully can transition to p.o. tomorrow and possible discharge tomorrow.  I will set her up for follow-up for ureteroscopy in 2 weeks or so.  No further urological intervention planned inpatient.   Link Snuffer, MD Urology 01/12/2022, 4:01 PM

## 2022-01-13 ENCOUNTER — Encounter (HOSPITAL_COMMUNITY): Payer: Self-pay | Admitting: Internal Medicine

## 2022-01-13 DIAGNOSIS — Z9889 Other specified postprocedural states: Secondary | ICD-10-CM | POA: Diagnosis not present

## 2022-01-13 LAB — URINE CULTURE: Culture: >=100000 — AB

## 2022-01-13 LAB — BASIC METABOLIC PANEL
Anion gap: 11 (ref 5–15)
BUN: 10 mg/dL (ref 6–20)
CO2: 19 mmol/L — ABNORMAL LOW (ref 22–32)
Calcium: 8.3 mg/dL — ABNORMAL LOW (ref 8.9–10.3)
Chloride: 108 mmol/L (ref 98–111)
Creatinine, Ser: 0.74 mg/dL (ref 0.44–1.00)
GFR, Estimated: >60 mL/min (ref >60)
Glucose, Bld: 101 mg/dL — ABNORMAL HIGH (ref 70–99)
Potassium: 3.5 mmol/L (ref 3.5–5.1)
Sodium: 138 mmol/L (ref 135–145)

## 2022-01-13 LAB — CBC WITH DIFFERENTIAL/PLATELET
Abs Immature Granulocytes: 0.17 10*3/uL — ABNORMAL HIGH (ref 0.00–0.07)
Basophils Absolute: 0 10*3/uL (ref 0.0–0.1)
Basophils Relative: 0 %
Eosinophils Absolute: 0.2 10*3/uL (ref 0.0–0.5)
Eosinophils Relative: 2 %
HCT: 29.7 % — ABNORMAL LOW (ref 36.0–46.0)
Hemoglobin: 9.5 g/dL — ABNORMAL LOW (ref 12.0–15.0)
Immature Granulocytes: 2 %
Lymphocytes Relative: 26 %
Lymphs Abs: 2.4 10*3/uL (ref 0.7–4.0)
MCH: 29.3 pg (ref 26.0–34.0)
MCHC: 32 g/dL (ref 30.0–36.0)
MCV: 91.7 fL (ref 80.0–100.0)
Monocytes Absolute: 0.5 10*3/uL (ref 0.1–1.0)
Monocytes Relative: 5 %
Neutro Abs: 6.1 10*3/uL (ref 1.7–7.7)
Neutrophils Relative %: 65 %
Platelets: 269 10*3/uL (ref 150–400)
RBC: 3.24 MIL/uL — ABNORMAL LOW (ref 3.87–5.11)
RDW: 14.2 % (ref 11.5–15.5)
WBC: 9.4 10*3/uL (ref 4.0–10.5)
nRBC: 0.2 % (ref 0.0–0.2)

## 2022-01-13 LAB — MAGNESIUM: Magnesium: 1.7 mg/dL (ref 1.7–2.4)

## 2022-01-13 MED ORDER — SULFAMETHOXAZOLE-TRIMETHOPRIM 800-160 MG PO TABS: 1.0000 | ORAL_TABLET | Freq: Two times a day (BID) | ORAL | 0 refills | Status: AC

## 2022-01-13 NOTE — Discharge Summary (Signed)
Physician Discharge Summary  Taylor Ramsey UJW:[id number] DOB: 08-25-2000 DOA: 01/10/2022  PCP: [facility]  Admit date: 01/10/2022 Discharge date: 01/13/2022  Admitted From: Home Discharge disposition: Home  Recommendations at discharge:  Complete 2 weeks course of antibiotics with 10 more days of oral Bactrim. Because of the risk of side effects to newborn baby, we recommend you not to breast-feed the baby for 2 weeks.  Recommend to pump and discard the breastmilk.  Brief narrative: Taylor Ramsey is a 21 y.o. female with PMH significant for asthma, malaria, vitamin D deficiency who had her first delivery a week prior with no complications during the pregnancy or postpartum. 11/10, patient was brought to the ED with generalized body ache and progressive lethargy within 24 hours. In the ED, patient was obtunded, febrile, tachycardic, tachypneic CT abdomen pelvis showed bilateral pyelonephritis, with an obstructing punctate right ureterovesicular junction calculus and upstream hydronephrosis. Urinalysis with trace leukocyte, negative nitrite, many bacteria. She was started on IV antibiotics.  Urology was consulted Patient underwent emergent cystoscopy with right retrograde pyelogram, right ureteral stent placement. Admitted to [facility]  Subjective: Patient was seen and examined this morning.  Pleasant.  Propped up on bed.  Not in distress. Chart reviewed In the last 24 hours, no fever, Tmax 99.5, blood pressure stable, breathing on room air Last set of labs from this morning with WBC count normal at 9.4  Assessment and plan: Sepsis POA Bilateral pyelonephritis  Obstructive uropathy s/p stenting Presented septic due to pyelonephritis.   Imagings as above.   Underwent emergent right ureteral stent placement Blood culture did not show any growth.   Urine culture reported more than 100,000 CFU per mL of Enterobacter aerogenes. Currently on IV Rocephin. No  fever, WBC count normalized. Feels better, ready to go home today.   Culture and sensitivity reviewed.  Case was discussed with patient's OB Dr. Sallye Ober as well as the pharmacist.  The only feasible oral antibiotic at discharge is Bactrim.  However its not indicated in breast-feeding mothers.  I called and spoke to the patient about it.  We will discharge her on 10-days of oral Bactrim to complete 2 weeks course.  I have instructed her to not breast-feed the baby for 2 weeks (including enough time for Bactrim clearance from her system).  Foley catheter removed today.  Able to void after removal.  Hypoglycemia 1 episode of hypoglycemia down to 41 on 11/11.  Likely due to sepsis and poor oral intake.  No history of diabetes.   She was briefly placed on dextrose drip.  Currently has good oral intake.  Blood sugar level improved and stable.   Hypokalemia /hypomagnesemia Improved with replacement. Recent Labs  Lab 01/10/22 2227 01/11/22 0839 01/12/22 0113 01/13/22 0149  K 3.3* 4.3 4.1 3.5  MG  --  1.6* 2.2 1.7   Chronic anemia Likely due to pregnancy.  Hemoglobin stable between 9 and 10. Prenatal vitamins to continue. Recent Labs    01/03/22 0310 01/10/22 2227 01/11/22 0839 01/12/22 0113 01/13/22 0149  HGB 9.4* 10.3* 9.0* 8.5* 9.5*  MCV 92.9 92.0 92.3 91.6 91.7   Wounds:  -    Discharge Exam:   Vitals:   01/12/22 1700 01/12/22 2220 01/13/22 0638 01/13/22 0900  BP: (!) 140/94 131/80 120/81 118/86  Pulse: 79 79 85 74  Resp: Temp: 98.5 F (36.9 C) 99.2 F (37.3 C) 99.5 F (37.5 C) 98.3 F (36.8 C)  TempSrc: Oral Oral  Oral Oral  SpO2: 96% 100% 97% 97%  Weight:      Height:        Body mass index is 30.36 kg/m.  General exam: Pleasant, young female.  Not in distress Skin: No rashes, lesions or ulcers. HEENT: Atraumatic, normocephalic, no obvious bleeding Lungs: Clear to auscultation bilaterally CVS: Regular rate and rhythm, no murmur GI/Abd soft,  nontender, nondistended, bowel sound present CNS: Alert, awake, oriented x3 Psychiatry: Mood appropriate Extremities: No edema, no calf tenderness  Follow ups:    Follow-up Information     [facility] Follow up.   Specialty: Pediatrics Contact information: [address] [place] [address] [phone]         Jaymes Graff, MD .   Specialty: Obstetrics and Gynecology Contact information: [address] [place] [address] [phone]                 Discharge Instructions:   Discharge Instructions     Call MD for:  difficulty breathing, headache or visual disturbances   Complete by: As directed    Call MD for:  extreme fatigue   Complete by: As directed    Call MD for:  hives   Complete by: As directed    Call MD for:  persistant dizziness or light-headedness   Complete by: As directed    Call MD for:  persistant nausea and vomiting   Complete by: As directed    Call MD for:  severe uncontrolled pain   Complete by: As directed    Call MD for:  temperature >100.4   Complete by: As directed    Diet general   Complete by: As directed    Discharge instructions   Complete by: As directed    Complete the course of antibiotics as recommended.  General discharge instructions: Follow with Primary MD [facility] in 7 days  Please request your PCP  to go over your hospital tests, procedures, radiology results at the follow up. Please get your medicines reviewed and adjusted.  Your PCP may decide to repeat certain labs or tests as needed. Do not drive, operate heavy machinery, perform activities at heights, swimming or participation in water activities or provide baby sitting services if your were admitted for syncope or siezures until you have seen by Primary MD or a Neurologist and advised to do so again. [place] Controlled Substance Reporting System database was reviewed. Do  not drive, operate heavy machinery, perform activities at heights, swim, participate in water activities or provide baby-sitting services while on medications for pain, sleep and mood until your outpatient physician has reevaluated you and advised to do so again.  You are strongly recommended to comply with the dose, frequency and duration of prescribed medications. Activity: As tolerated with Full fall precautions use walker/cane & assistance as needed Avoid using any recreational substances like cigarette, tobacco, alcohol, or non-prescribed drug. If you experience worsening of your admission symptoms, develop shortness of breath, life threatening emergency, suicidal or homicidal thoughts you must seek medical attention immediately by calling 911 or calling your MD immediately  if symptoms less severe. You must read complete instructions/literature along with all the possible adverse reactions/side effects for all the medicines you take and that have been prescribed to you. Take any new medicine only after you have completely understood and accepted all the possible adverse reactions/side effects.  Wear Seat belts while driving. You were cared for  by a hospitalist during your hospital stay. If you have any questions about your discharge medications or the care you received while you were in the hospital after you are discharged, you can call the unit and ask to speak with the hospitalist or the covering physician. Once you are discharged, your primary care physician will handle any further medical issues. Please note that NO REFILLS for any discharge medications will be authorized once you are discharged, as it is imperative that you return to your primary care physician (or establish a relationship with a primary care physician if you do not have one).   Increase activity slowly   Complete by: As directed        Discharge Medications:   Allergies as of 01/13/2022   No Known Allergies       Medication List     STOP taking these medications    acetaminophen 325 MG tablet Commonly known as: TYLENOL   albuterol 108 (90 Base) MCG/ACT inhaler Commonly known as: VENTOLIN HFA       TAKE these medications    ibuprofen 600 MG tablet Commonly known as: ADVIL Take 1 tablet (600 mg total) by mouth every 6 (six) hours.   multivitamin-prenatal 27-0.8 MG Tabs tablet Take 1 tablet by mouth daily at 12 noon.   sulfamethoxazole-trimethoprim 800-160 MG tablet Commonly known as: BACTRIM DS Take 1 tablet by mouth 2 (two) times daily for 10 days.         The results of significant diagnostics from this hospitalization (including imaging, microbiology, ancillary and laboratory) are listed below for reference.    Procedures and Diagnostic Studies:   DG Abd 1 View  Result Date: 01/11/2022 CLINICAL DATA:  21 year old female undergoing cystoscopy and retrograde pyelogram for right side obstructive uropathy. EXAM: ABDOMEN - 1 VIEW COMPARISON:  CT Abdomen and Pelvis 0044 hours today. FINDINGS: Four intraoperative fluoroscopic spot views of the right abdomen and pelvis. Cystoscope in place. These images demonstrate contrast injection to the right ureter and renal collecting system with subsequent double-J right ureteral stent placement. Proximal and distal pigtail appear normally located. Fluoroscopy time: 0 minutes 24 seconds.  5.1 mGy IMPRESSION: Double-J right ureteral stent placed with no adverse features. Electronically Signed   By: Odessa Fleming M.D.   On: 01/11/2022 05:58   DG C-Arm 1-60 Min-No Report  Result Date: 01/11/2022 Fluoroscopy was utilized by the requesting physician.  No radiographic interpretation.   CT ABDOMEN PELVIS W CONTRAST  Result Date: 01/11/2022 CLINICAL DATA:  Sepsis EXAM: CT ABDOMEN AND PELVIS WITH CONTRAST TECHNIQUE: Multidetector CT imaging of the abdomen and pelvis was performed using the standard protocol following bolus administration of intravenous  contrast. RADIATION DOSE REDUCTION: This exam was performed according to the departmental dose-optimization program which includes automated exposure control, adjustment of the mA and/or kV according to patient size and/or use of iterative reconstruction technique. CONTRAST:  32mL OMNIPAQUE IOHEXOL 350 MG/ML SOLN COMPARISON:  None Available. FINDINGS: Lower chest: Developing left lower lobe airspace opacity. Hepatobiliary: No focal liver abnormality. No gallstones, gallbladder wall thickening, or pericholecystic fluid. No biliary dilatation. Pancreas: No focal lesion. Normal pancreatic contour. No surrounding inflammatory changes. No main pancreatic ductal dilatation. Spleen: Normal in size without focal abnormality. Adrenals/Urinary Tract: No adrenal nodule bilaterally. Striated nephrograms bilaterally. Mild right hydronephrosis. No definite left hydronephrosis. Punctate right nephrolithiasis. Punctate left nephrolithiasis. Punctate density in the region the right ureterovesicular junction. No left ureterolithiasis. Mild urothelial thickening of the right collecting system. The urinary bladder is  unremarkable. Stomach/Bowel: Stomach is within normal limits. No evidence of bowel wall thickening or dilatation. Appendix appears normal. Vascular/Lymphatic: No abdominal aorta or iliac aneurysm. Mild atherosclerotic plaque of the aorta and its branches. No abdominal, pelvic, or inguinal lymphadenopathy. Reproductive: Slightly enlarged uterus consistent with a postpartum uterus. Otherwise uterus and bilateral adnexa are unremarkable. Other: No intraperitoneal free fluid. No intraperitoneal free gas. No organized fluid collection. Musculoskeletal: No abdominal wall hernia or abnormality. No suspicious lytic or blastic osseous lesions. No acute displaced fracture. Multilevel degenerative changes of the spine. IMPRESSION: 1. Obstructive punctate right ureterovesicular junction stone with superimposed bilateral  pyelonephritis. No abscess formation. 2. Nonobstructive bilateral punctate nephrolithiasis. 3. Postpartum uterus. 4. Developing left lower lobe airspace opacity. Finding likely represents atelectasis. Electronically Signed   By: Tish FredericksonMorgane  Naveau M.D.   On: 01/11/2022 01:21   CT Head Wo Contrast  Result Date: 01/11/2022 CLINICAL DATA:  Altered mental status, nontraumatic (Ped 0-17y) EXAM: CT HEAD WITHOUT CONTRAST TECHNIQUE: Contiguous axial images were obtained from the base of the skull through the vertex without intravenous contrast. RADIATION DOSE REDUCTION: This exam was performed according to the departmental dose-optimization program which includes automated exposure control, adjustment of the mA and/or kV according to patient size and/or use of iterative reconstruction technique. COMPARISON:  MRI head 06/29/2021 FINDINGS: Brain: No evidence of large-territorial acute infarction. No parenchymal hemorrhage. No mass lesion. No extra-axial collection. No mass effect or midline shift. No hydrocephalus. Basilar cisterns are patent. Vascular: No hyperdense vessel. Skull: No acute fracture or focal lesion. Sinuses/Orbits: Paranasal sinuses and mastoid air cells are clear. The orbits are unremarkable. Other: None. IMPRESSION: No acute intracranial abnormality. Electronically Signed   By: Tish FredericksonMorgane  Naveau M.D.   On: 01/11/2022 01:09   CT L-SPINE NO CHARGE  Result Date: 01/11/2022 CLINICAL DATA:  Sepsis.  One week postpartum. EXAM: CT LUMBAR SPINE WITHOUT CONTRAST TECHNIQUE: Multidetector CT imaging of the lumbar spine was performed without intravenous contrast administration. Multiplanar CT image reconstructions were also generated. RADIATION DOSE REDUCTION: This exam was performed according to the departmental dose-optimization program which includes automated exposure control, adjustment of the mA and/or kV according to patient size and/or use of iterative reconstruction technique. COMPARISON:  None Available.  FINDINGS: Segmentation: 5 lumbar type vertebrae. Alignment: Normal. Vertebrae: No acute fracture or focal pathologic process. Paraspinal and other soft tissues: Negative. Disc levels: Maintained. IMPRESSION: 1. No acute displaced fracture or traumatic listhesis of the lumbar spine. 2. Please see separately dictated CT head, abdomen, pelvis 01/11/2022. Electronically Signed   By: Tish FredericksonMorgane  Naveau M.D.   On: 01/11/2022 01:02   DG Chest Port 1 View  Result Date: 01/10/2022 CLINICAL DATA:  Postpartum pain EXAM: PORTABLE CHEST 1 VIEW COMPARISON:  None Available. FINDINGS: Low lung volumes. Cardiac size upper normal. Central bronchovascular crowding. No acute airspace disease, pleural effusion or pneumothorax IMPRESSION: Low lung volumes with central bronchovascular crowding. No acute airspace disease. Electronically Signed   By: Jasmine PangKim  Fujinaga M.D.   On: 01/10/2022 23:04     Labs:   Basic Metabolic Panel: Recent Labs  Lab 01/10/22 2227 01/11/22 0839 01/12/22 0113 01/13/22 0149  NA 138 140 139 138  K 3.3* 4.3 4.1 3.5  CL 107 110 112* 108  CO2 19* 18* 20* 19*  GLUCOSE 197* 109* 112* 101*  BUN 14 9 10 10   CREATININE 0.92 0.67 0.69 0.74  CALCIUM 8.5* 8.2* 8.5* 8.3*  MG  --  1.6* 2.2 1.7   GFR Estimated Creatinine Clearance: 107.5 mL/min (by  C-G formula based on SCr of 0.74 mg/dL). Liver Function Tests: Recent Labs  Lab 01/10/22 2227  AST 18  ALT 13  ALKPHOS 96  BILITOT 0.7  PROT 6.6  ALBUMIN 2.9*   No results for input(s): "LIPASE", "AMYLASE" in the last 168 hours. No results for input(s): "AMMONIA" in the last 168 hours. Coagulation profile Recent Labs  Lab 01/10/22 2227  INR 1.1    CBC: Recent Labs  Lab 01/10/22 2227 01/11/22 0839 01/12/22 0113 01/13/22 0149  WBC 11.3* 11.1* 10.6* 9.4  NEUTROABS 10.2*  --  8.5* 6.1  HGB 10.3* 9.0* 8.5* 9.5*  HCT 32.1* 28.8* 27.1* 29.7*  MCV 92.0 92.3 91.6 91.7  PLT 232 214 251 269   Cardiac Enzymes: No results for input(s):  "CKTOTAL", "CKMB", "CKMBINDEX", "TROPONINI" in the last 168 hours. BNP: Invalid input(s): "POCBNP" CBG: Recent Labs  Lab 01/10/22 2130 01/10/22 2223 01/11/22 0219 01/11/22 0252 01/11/22 0349  GLUCAP 30* 94 41* 146* 107*   D-Dimer No results for input(s): "DDIMER" in the last 72 hours. Hgb A1c No results for input(s): "HGBA1C" in the last 72 hours. Lipid Profile No results for input(s): "CHOL", "HDL", "LDLCALC", "TRIG", "CHOLHDL", "LDLDIRECT" in the last 72 hours. Thyroid function studies No results for input(s): "TSH", "T4TOTAL", "T3FREE", "THYROIDAB" in the last 72 hours.  Invalid input(s): "FREET3" Anemia work up No results for input(s): "VITAMINB12", "FOLATE", "FERRITIN", "TIBC", "IRON", "RETICCTPCT" in the last 72 hours. Microbiology Recent Results (from the past 240 hour(s))  Blood Culture (routine x 2)     Status: None (Preliminary result)   Collection Time: 01/10/22 10:27 PM   Specimen: BLOOD  Result Value Ref Range Status   Specimen Description BLOOD BLOOD RIGHT ARM  Final   Special Requests   Final    BOTTLES DRAWN AEROBIC AND ANAEROBIC Blood Culture adequate volume   Culture   Final    NO GROWTH 3 DAYS Performed at [facility], [address], [place], [place] [address]    Report Status PENDING  Incomplete  Resp Panel by RT-PCR (Flu A&B, Covid) Anterior Nasal Swab     Status: None   Collection Time: 01/10/22 10:40 PM   Specimen: Anterior Nasal Swab  Result Value Ref Range Status   SARS Coronavirus 2 by RT PCR NEGATIVE NEGATIVE Final    Comment: (NOTE) SARS-CoV-2 target nucleic acids are NOT DETECTED.  The SARS-CoV-2 RNA is generally detectable in upper respiratory specimens during the acute phase of infection. The lowest concentration of SARS-CoV-2 viral copies this assay can detect is 138 copies/mL. A negative result does not preclude SARS-Cov-2 infection and should not be used as the sole basis for treatment or other patient management decisions.  A negative result may occur with  improper specimen collection/handling, submission of specimen other than nasopharyngeal swab, presence of viral mutation(s) within the areas targeted by this assay, and inadequate number of viral copies(<138 copies/mL). A negative result must be combined with clinical observations, patient history, and epidemiological information. The expected result is Negative.  Fact Sheet for Patients:  BloggerCourse.com  Fact Sheet for Healthcare Providers:  SeriousBroker.it  This test is no t yet approved or cleared by the [place] FDA and  has been authorized for detection and/or diagnosis of SARS-CoV-2 by FDA under an Emergency Use Authorization (EUA). This EUA will remain  in effect (meaning this test can be used) for the duration of the COVID-19 declaration under Section 564(b)(1) of the Act, 21 U.S.C.section 360bbb-3(b)(1), unless the authorization is  terminated  or revoked sooner.       Influenza A by PCR NEGATIVE NEGATIVE Final   Influenza B by PCR NEGATIVE NEGATIVE Final    Comment: (NOTE) The Xpert Xpress SARS-CoV-2/FLU/RSV plus assay is intended as an aid in the diagnosis of influenza from Nasopharyngeal swab specimens and should not be used as a sole basis for treatment. Nasal washings and aspirates are unacceptable for Xpert Xpress SARS-CoV-2/FLU/RSV testing.  Fact Sheet for Patients: BloggerCourse.com  Fact Sheet for Healthcare Providers: SeriousBroker.it  This test is not yet approved or cleared by the [place] FDA and has been authorized for detection and/or diagnosis of SARS-CoV-2 by FDA under an Emergency Use Authorization (EUA). This EUA will remain in effect (meaning this test can be used) for the duration of the COVID-19 declaration under Section 564(b)(1) of the Act, 21 U.S.C. section 360bbb-3(b)(1), unless the authorization  is terminated or revoked.  Performed at [facility], [address], [place], [place] [address]   Urine Culture     Status: Abnormal   Collection Time: 01/11/22 12:15 AM   Specimen: In/Out Cath Urine  Result Value Ref Range Status   Specimen Description IN/OUT CATH URINE  Final   Special Requests   Final    NONE Performed at [facility], [address] [place], [place] [address]    Culture >=100,000 COLONIES/mL ENTEROBACTER AEROGENES (A)  Final   Report Status 01/13/2022 FINAL  Final   Organism ID, Bacteria ENTEROBACTER AEROGENES (A)  Final      Susceptibility   Enterobacter aerogenes - MIC*    CEFAZOLIN >=64 RESISTANT Resistant     CEFEPIME <=0.12 SENSITIVE Sensitive     CEFTRIAXONE <=0.25 SENSITIVE Sensitive     CIPROFLOXACIN <=0.25 SENSITIVE Sensitive     GENTAMICIN <=1 SENSITIVE Sensitive     IMIPENEM 1 SENSITIVE Sensitive     NITROFURANTOIN 64 INTERMEDIATE Intermediate     TRIMETH/SULFA <=20 SENSITIVE Sensitive     PIP/TAZO <=4 SENSITIVE Sensitive     * >=100,000 COLONIES/mL ENTEROBACTER AEROGENES    Time coordinating discharge: 35 minutes  Signed: Madeliene Tejera  [facility] 01/13/2022, 1:47 PM

## 2022-01-14 ENCOUNTER — Telehealth (HOSPITAL_COMMUNITY): Payer: Self-pay | Admitting: *Deleted

## 2022-01-14 NOTE — Telephone Encounter (Signed)
Mom reports feeling good. No concerns about herself at this time. EPDS=3 [facility] score=3) Mom reports baby is doing well. Feeding, peeing, and pooping without difficulty. Safe sleep reviewed. Mom reports no concerns about baby at present.  Duffy Rhody, RN 01-14-2022 at 3:23pm

## 2022-01-15 LAB — CULTURE, BLOOD (ROUTINE X 2)
Culture: NO GROWTH
Special Requests: ADEQUATE

## 2022-01-22 ENCOUNTER — Encounter (HOSPITAL_COMMUNITY): Payer: Self-pay | Admitting: Obstetrics & Gynecology

## 2022-01-24 ENCOUNTER — Emergency Department (HOSPITAL_COMMUNITY): Payer: Medicaid Other

## 2022-01-24 ENCOUNTER — Emergency Department (HOSPITAL_COMMUNITY)
Admission: EM | Admit: 2022-01-24 | Discharge: 2022-01-24 | Disposition: A | Discharge status: Home / Self Care | Payer: Medicaid Other | Attending: Emergency Medicine | Admitting: Emergency Medicine

## 2022-01-24 ENCOUNTER — Encounter (HOSPITAL_COMMUNITY): Payer: Self-pay

## 2022-01-24 ENCOUNTER — Other Ambulatory Visit: Payer: Self-pay

## 2022-01-24 DIAGNOSIS — R1030 Lower abdominal pain, unspecified: Secondary | ICD-10-CM | POA: Insufficient documentation

## 2022-01-24 DIAGNOSIS — J45909 Unspecified asthma, uncomplicated: Secondary | ICD-10-CM | POA: Diagnosis not present

## 2022-01-24 DIAGNOSIS — N390 Urinary tract infection, site not specified: Secondary | ICD-10-CM

## 2022-01-24 DIAGNOSIS — R509 Fever, unspecified: Secondary | ICD-10-CM | POA: Diagnosis not present

## 2022-01-24 LAB — CBC WITH DIFFERENTIAL/PLATELET
Abs Immature Granulocytes: 0.01 10*3/uL (ref 0.00–0.07)
Basophils Absolute: 0 10*3/uL (ref 0.0–0.1)
Basophils Relative: 0 %
Eosinophils Absolute: 0 10*3/uL (ref 0.0–0.5)
Eosinophils Relative: 1 %
HCT: 35 % — ABNORMAL LOW (ref 36.0–46.0)
Hemoglobin: 11.2 g/dL — ABNORMAL LOW (ref 12.0–15.0)
Immature Granulocytes: 0 %
Lymphocytes Relative: 12 %
Lymphs Abs: 0.5 10*3/uL — ABNORMAL LOW (ref 0.7–4.0)
MCH: 28.6 pg (ref 26.0–34.0)
MCHC: 32 g/dL (ref 30.0–36.0)
MCV: 89.5 fL (ref 80.0–100.0)
Monocytes Absolute: 0.2 10*3/uL (ref 0.1–1.0)
Monocytes Relative: 5 %
Neutro Abs: 3.4 10*3/uL (ref 1.7–7.7)
Neutrophils Relative %: 82 %
Platelets: 297 10*3/uL (ref 150–400)
RBC: 3.91 MIL/uL (ref 3.87–5.11)
RDW: 15.1 % (ref 11.5–15.5)
WBC: 4.1 10*3/uL (ref 4.0–10.5)
nRBC: 0 % (ref 0.0–0.2)

## 2022-01-24 LAB — URINALYSIS, ROUTINE W REFLEX MICROSCOPIC
Bilirubin Urine: NEGATIVE
Bilirubin Urine: NEGATIVE
Glucose, UA: NEGATIVE mg/dL
Glucose, UA: NEGATIVE mg/dL
Ketones, ur: 20 mg/dL — AB
Ketones, ur: 20 mg/dL — AB
Nitrite: NEGATIVE
Nitrite: NEGATIVE
Protein, ur: 100 mg/dL — AB
Protein, ur: 30 mg/dL — AB
RBC / HPF: >50 RBC/hpf — ABNORMAL HIGH (ref 0–5)
RBC / HPF: >50 RBC/hpf — ABNORMAL HIGH (ref 0–5)
Specific Gravity, Urine: 1.025 (ref 1.005–1.030)
Specific Gravity, Urine: >1.046 — ABNORMAL HIGH (ref 1.005–1.030)
pH: 5 (ref 5.0–8.0)
pH: 6 (ref 5.0–8.0)

## 2022-01-24 LAB — COMPREHENSIVE METABOLIC PANEL
ALT: 9 U/L (ref 0–44)
AST: 15 U/L (ref 15–41)
Albumin: 3.4 g/dL — ABNORMAL LOW (ref 3.5–5.0)
Alkaline Phosphatase: 68 U/L (ref 38–126)
Anion gap: 12 (ref 5–15)
BUN: 16 mg/dL (ref 6–20)
CO2: 17 mmol/L — ABNORMAL LOW (ref 22–32)
Calcium: 8.7 mg/dL — ABNORMAL LOW (ref 8.9–10.3)
Chloride: 104 mmol/L (ref 98–111)
Creatinine, Ser: 0.81 mg/dL (ref 0.44–1.00)
GFR, Estimated: >60 mL/min (ref >60)
Glucose, Bld: 96 mg/dL (ref 70–99)
Potassium: 3.7 mmol/L (ref 3.5–5.1)
Sodium: 133 mmol/L — ABNORMAL LOW (ref 135–145)
Total Bilirubin: 0.9 mg/dL (ref 0.3–1.2)
Total Protein: 6.9 g/dL (ref 6.5–8.1)

## 2022-01-24 LAB — I-STAT BETA HCG BLOOD, ED (MC, WL, AP ONLY): I-stat hCG, quantitative: <5 m[IU]/mL

## 2022-01-24 LAB — LACTIC ACID, PLASMA: Lactic Acid, Venous: 1.2 mmol/L (ref 0.5–1.9)

## 2022-01-24 MED ORDER — SODIUM CHLORIDE 0.9 % IV SOLN
2.0000 g | Freq: Once | INTRAVENOUS | Status: AC
  Administered 2022-01-24: 2 g via INTRAVENOUS
  Filled 2022-01-24: qty 12.5

## 2022-01-24 MED ORDER — IOHEXOL 350 MG/ML SOLN
75.0000 mL | Freq: Once | INTRAVENOUS | Status: AC | PRN
  Administered 2022-01-24: 75 mL via INTRAVENOUS

## 2022-01-24 MED ORDER — LACTATED RINGERS IV BOLUS (SEPSIS)
1000.0000 mL | Freq: Once | INTRAVENOUS | Status: AC
  Administered 2022-01-24: 1000 mL via INTRAVENOUS

## 2022-01-24 MED ORDER — KETOROLAC TROMETHAMINE 15 MG/ML IJ SOLN
15.0000 mg | Freq: Once | INTRAMUSCULAR | Status: AC
  Administered 2022-01-24: 15 mg via INTRAVENOUS
  Filled 2022-01-24: qty 1

## 2022-01-24 MED ORDER — CEFDINIR 300 MG PO CAPS: 300.0000 mg | ORAL_CAPSULE | Freq: Two times a day (BID) | ORAL | 0 refills | Status: DC

## 2022-01-24 NOTE — ED Notes (Signed)
Pt transported to CT at this time.

## 2022-01-24 NOTE — ED Notes (Signed)
Pt unhooked from monitoring equipment and given wipes and specimen cup to obtain clean catch urine

## 2022-01-24 NOTE — ED Provider Notes (Signed)
[facility] EMERGENCY DEPARTMENT Provider Note   CSN: [id number] Arrival date & time: 01/24/22  0304     History  Chief Complaint  Patient presents with   Flank Pain    Taylor Ramsey is a 21 y.o. female.  21 y/o female, 3 weeks postpartum, presents to the ED for c/o abdominal pain and flank pain. Pain began yesterday and has been constant, worsening. States that pain is present in her lower abdomen and radiates to her back. She has been experiencing associated body aches, fever (Tmax 100.4), and malaise. Patient has tried tylenol and ibuprofen for symptoms w/o relief. She finished a course of Bactrim yesterday and was fully compliant with this abx course.  Of note, patient is 11 days s/p hospitalization for severe sepsis 2/2 obstructing ureteral stone and UTI. Her urine culture grew Enterobacter Aerogenes sensitive to Bactrim. She underwent cystoscopy and R ureteral stent placement on 01/11/22. She denies dysuria, hematuria, vomiting, bowel changes. She continues to have sporadic, waxing/waning vaginal bleeding since her vaginal delivery on 01/02/22.  The history is provided by the patient. No language interpreter was used.  Flank Pain       Home Medications Prior to Admission medications   Medication Sig Start Date End Date Taking? Authorizing Provider  ibuprofen (ADVIL) 600 MG tablet Take 1 tablet (600 mg total) by mouth every 6 (six) hours. 01/04/22   Roma Schanz, CNM  Prenatal Vit-Fe Fumarate-FA (MULTIVITAMIN-PRENATAL) 27-0.8 MG TABS tablet Take 1 tablet by mouth daily at 12 noon. Patient not taking: Reported on 01/10/2022    [provider]      Allergies    Patient has no known allergies.    Review of Systems   Review of Systems  Genitourinary:  Positive for flank pain.  Ten systems reviewed and are negative for acute change, except as noted in the HPI.    Physical Exam Updated Vital Signs BP (!) 107/57   Pulse 91   Temp 100.1 F  (37.8 C) (Oral)   Resp 18   Ht 5\' 2"  (1.575 m)   Wt 64.4 kg   LMP 03/30/2021 (Exact Date)   SpO2 98%   Breastfeeding No   BMI 25.97 kg/m   Physical Exam Vitals and nursing note reviewed.  Constitutional:      General: She is not in acute distress.    Appearance: She is well-developed. She is not diaphoretic.     Comments: Ill appearing, nontoxic.  HENT:     Head: Normocephalic and atraumatic.  Eyes:     General: No scleral icterus.    Conjunctiva/sclera: Conjunctivae normal.  Cardiovascular:     Rate and Rhythm: Regular rhythm. Tachycardia present.     Pulses: Normal pulses.     Comments: Mild tachycardia Pulmonary:     Effort: Pulmonary effort is normal. No respiratory distress.     Breath sounds: No stridor. No wheezing.     Comments: Respirations even and unlabored. Lungs CTAB. Abdominal:     Palpations: Abdomen is soft.     Tenderness: There is abdominal tenderness.     Comments: Abdomen soft, nondistended. There is TTP in the suprapubic abdomen, bilateral lower quadrants. No involuntary guarding or peritoneal signs.  Musculoskeletal:        General: Normal range of motion.     Cervical back: Normal range of motion.  Skin:    General: Skin is warm and dry.     Coloration: Skin is not pale.  Findings: Rash (erythematous, maculopapular pruritic rash to b/l distal extremities) present. No erythema.  Neurological:     Mental Status: She is alert and oriented to person, place, and time.     Coordination: Coordination normal.  Psychiatric:        Behavior: Behavior normal.     ED Results / Procedures / Treatments   Labs (all labs ordered are listed, but only abnormal results are displayed) Labs Reviewed  URINALYSIS, ROUTINE W REFLEX MICROSCOPIC - Abnormal; Notable for the following components:      Result Value   APPearance CLOUDY (*)    Hgb urine dipstick LARGE (*)    Ketones, ur 20 (*)    Protein, ur 100 (*)    Leukocytes,Ua LARGE (*)    RBC / HPF >50  (*)    Bacteria, UA MANY (*)    All other components within normal limits  CBC WITH DIFFERENTIAL/PLATELET - Abnormal; Notable for the following components:   Hemoglobin 11.2 (*)    HCT 35.0 (*)    Lymphs Abs 0.5 (*)    All other components within normal limits  CULTURE, BLOOD (ROUTINE X 2)  CULTURE, BLOOD (ROUTINE X 2)  URINE CULTURE  LACTIC ACID, PLASMA  LACTIC ACID, PLASMA  COMPREHENSIVE METABOLIC PANEL  I-STAT BETA HCG BLOOD, ED ([facility], [facility], [facility] ONLY)    EKG None  Radiology No results found.  Procedures Procedures    Medications Ordered in ED Medications  ketorolac (TORADOL) 15 MG/ML injection 15 mg (has no administration in time range)  lactated ringers bolus 1,000 mL (1,000 mLs Intravenous New Bag/Given 01/24/22 0401)  lactated ringers bolus 1,000 mL (1,000 mLs Intravenous New Bag/Given 01/24/22 0511)  ceFEPIme (MAXIPIME) 2 g in sodium chloride 0.9 % 100 mL IVPB (0 g Intravenous Stopped 01/24/22 0555)    ED Course/ Medical Decision Making/ A&P Clinical Course as of 01/24/22 0558  Fri Jan 24, 2022  0548 Repeat abdominal exam notable for persistent lower tenderness. [KH]  0557 Patient's urinalysis appears contaminated with 21-50 squamous cells.  Unable to exclude persistent UTI given presence of many bacteria as well as pyuria.  Will order repeat UA for recollection.  Given possibility of failed UTI treatment, patient started on IV Cefepime based on previous culture sensitivities.  [KH]    Clinical Course User Index [KH] Antony Madura, PA-C                           Medical Decision Making Amount and/or Complexity of Data Reviewed Labs: ordered. Radiology: ordered.  Risk Prescription drug management.   This patient presents to the ED for concern of lower abdominal pain w/chills and subjective fever, this involves an extensive number of treatment options, and is a complaint that carries with it a high risk of complications and morbidity.  The differential diagnosis  includes UTI vs pyelonephritis vs endometritis vs persistent ureteral obstruction vs viral illness   Co morbidities that complicate the patient evaluation  Recently postpartum Asthma    Additional history obtained:  External records from outside source obtained and reviewed including CT scan from previous admission   Lab Tests:  I Ordered, and personally interpreted labs.  The pertinent results include:  Normal WBC and lactic acid level. UA results suggest contamination.  Will order for recollection. CMP pending.   Imaging Studies ordered:  I ordered imaging studies including CT abdomen/pelvis  Results pending   Cardiac Monitoring:  The patient was maintained on a  cardiac monitor.  I personally viewed and interpreted the cardiac monitored which showed an underlying rhythm of: sinus tachycardia > NSR   Medicines ordered and prescription drug management:  I ordered medication including Toradol for pain and IVF for tachycardia. IV cefepime started for UTI coverage.  Reevaluation of the patient after these medicines showed that the patient improved I have reviewed the patients home medicines and have made adjustments as needed   Test Considered:  Pelvic US   Problem List / ED Course:  As above   Reevaluation:  After the interventions noted above, I reevaluated the patient and found that they have : remained stable   Social Determinants of Health:  Insured patient    Dispostion:  Patient signed out to [place] at shift change pending CT imaging and reassessment.         Final Clinical Impression(s) / ED Diagnoses Final diagnoses:  Lower abdominal pain    Rx / DC Orders ED Discharge Orders     None         Antony Madura, PA-C 01/24/22 5625    Dione Booze, MD 01/24/22 [id number]

## 2022-01-24 NOTE — ED Notes (Signed)
Reviewed discharge instructions with patient and significant other. Follow-up care and medications reviewed. Patient and significant other verbalized understanding. Patient A&Ox4, VSS, and ambulatory with steady gait upon discharge.  °

## 2022-01-24 NOTE — ED Notes (Signed)
Asked pt. 3 times for urin sample. Pt. Went to sleep and would not wake up the past two times I went into the room.

## 2022-01-24 NOTE — ED Provider Notes (Signed)
Pt's care assumed at 6:30 am.  Repeat ua and ct scan pending. Ct scan shows stent on right side.  No hydronephrosis.  Repeat Ua shows 21-50 wbc's and few bacteria.    Pt reports feeling better.  No tylenol since 2am.  Pt's temp is 97.9 orally.   I discussed pt with Dr. Suezanne Jacquet who advised Omnicef.   Pt advised to return if fever or increased/worsening symptoms    Osie Cheeks 01/24/22 1253    Lonell Grandchild, MD 01/24/22 1420

## 2022-01-24 NOTE — ED Triage Notes (Signed)
Pt in via EMS from home with right flank pain and fever for several days. Pt reports stent placed on 11/11 and is due to be removed on 12/1. Finished ABX yesterday

## 2022-01-24 NOTE — Discharge Instructions (Addendum)
Follow up with Urology as scheduled for recheck.  Return if symptoms worsen or change.

## 2022-01-24 NOTE — ED Notes (Signed)
Pt back from CT at this time 

## 2022-01-25 LAB — URINE CULTURE
Culture: 20000 — AB
Culture: <10000 — AB

## 2022-01-29 LAB — CULTURE, BLOOD (ROUTINE X 2)
Culture: NO GROWTH
Culture: NO GROWTH
Special Requests: ADEQUATE
Special Requests: ADEQUATE

## 2022-10-19 ENCOUNTER — Other Ambulatory Visit: Payer: Self-pay

## 2022-10-19 ENCOUNTER — Inpatient Hospital Stay (HOSPITAL_COMMUNITY)
Admission: EM | Admit: 2022-10-19 | Discharge: 2022-10-22 | DRG: 690 | Disposition: A | Discharge status: Home / Self Care | Payer: 59 | Attending: Internal Medicine | Admitting: Internal Medicine

## 2022-10-19 DIAGNOSIS — Z87442 Personal history of urinary calculi: Secondary | ICD-10-CM

## 2022-10-19 DIAGNOSIS — N12 Tubulo-interstitial nephritis, not specified as acute or chronic: Secondary | ICD-10-CM | POA: Diagnosis not present

## 2022-10-19 DIAGNOSIS — Z8613 Personal history of malaria: Secondary | ICD-10-CM

## 2022-10-19 DIAGNOSIS — E162 Hypoglycemia, unspecified: Secondary | ICD-10-CM | POA: Diagnosis present

## 2022-10-19 DIAGNOSIS — N10 Acute tubulo-interstitial nephritis: Secondary | ICD-10-CM | POA: Diagnosis not present

## 2022-10-19 DIAGNOSIS — E872 Acidosis, unspecified: Secondary | ICD-10-CM | POA: Diagnosis present

## 2022-10-19 DIAGNOSIS — D649 Anemia, unspecified: Secondary | ICD-10-CM | POA: Diagnosis present

## 2022-10-19 DIAGNOSIS — Z8249 Family history of ischemic heart disease and other diseases of the circulatory system: Secondary | ICD-10-CM

## 2022-10-19 DIAGNOSIS — Z841 Family history of disorders of kidney and ureter: Secondary | ICD-10-CM

## 2022-10-19 DIAGNOSIS — R109 Unspecified abdominal pain: Secondary | ICD-10-CM | POA: Diagnosis not present

## 2022-10-19 DIAGNOSIS — J45909 Unspecified asthma, uncomplicated: Secondary | ICD-10-CM | POA: Diagnosis present

## 2022-10-19 DIAGNOSIS — E876 Hypokalemia: Secondary | ICD-10-CM | POA: Diagnosis not present

## 2022-10-19 DIAGNOSIS — B9620 Unspecified Escherichia coli [E. coli] as the cause of diseases classified elsewhere: Secondary | ICD-10-CM | POA: Diagnosis present

## 2022-10-19 DIAGNOSIS — R319 Hematuria, unspecified: Secondary | ICD-10-CM | POA: Diagnosis present

## 2022-10-19 DIAGNOSIS — N200 Calculus of kidney: Secondary | ICD-10-CM | POA: Diagnosis not present

## 2022-10-19 LAB — URINALYSIS, W/ REFLEX TO CULTURE (INFECTION SUSPECTED)
Bilirubin Urine: NEGATIVE
Glucose, UA: NEGATIVE mg/dL
Hgb urine dipstick: NEGATIVE
Ketones, ur: 20 mg/dL — AB
Nitrite: NEGATIVE
Protein, ur: 30 mg/dL — AB
Specific Gravity, Urine: 1.014 (ref 1.005–1.030)
WBC, UA: >50 WBC/hpf (ref 0–5)
pH: 8 (ref 5.0–8.0)

## 2022-10-19 MED ORDER — HYDROMORPHONE HCL 1 MG/ML IJ SOLN
1.0000 mg | Freq: Once | INTRAMUSCULAR | Status: AC
  Administered 2022-10-20: 1 mg via INTRAVENOUS
  Filled 2022-10-19: qty 1

## 2022-10-19 MED ORDER — ONDANSETRON HCL 4 MG/2ML IJ SOLN
4.0000 mg | Freq: Once | INTRAMUSCULAR | Status: AC
  Administered 2022-10-20: 4 mg via INTRAVENOUS
  Filled 2022-10-19: qty 2

## 2022-10-19 MED ORDER — SODIUM CHLORIDE 0.9 % IV BOLUS
1000.0000 mL | Freq: Once | INTRAVENOUS | Status: AC
  Administered 2022-10-20: 1000 mL via INTRAVENOUS

## 2022-10-19 MED ORDER — ACETAMINOPHEN 500 MG PO TABS
1000.0000 mg | ORAL_TABLET | Freq: Once | ORAL | Status: AC
  Administered 2022-10-20: 1000 mg via ORAL
  Filled 2022-10-19: qty 2

## 2022-10-19 NOTE — ED Provider Notes (Signed)
[place] EMERGENCY DEPARTMENT AT [facility] Provider Note   CSN: [id number] Arrival date & time: 10/19/22  2229     History {Add pertinent medical, surgical, social history, OB history to HPI:1} Chief Complaint  Patient presents with   Flank Pain    left   Nausea   Emesis   Hematuria    Taylor Ramsey is a 22 y.o. female.  HPI   Patient with medical history including asthma, pyelonephritis, kidney stone status post stenting, presenting with complaints of right-sided flank pain.  Patient states she been having right-sided flank pain for last few days, but symptoms got worse today, she developed fevers chills some pain nausea or vomiting, states she has had some hematemesis and dysuria, states pain feels similar to when she had a stone in the past, she denies any bloody emesis or coffee-ground emesis denies any constipation or diarrhea no bloody stools or dark tarry stools, no associate chest pain shortness of breath, denies any recent sick contact, no recent travels.  Home Medications Prior to Admission medications   Medication Sig Start Date End Date Taking? Authorizing Provider  cefdinir (OMNICEF) 300 MG capsule Take 1 capsule (300 mg total) by mouth 2 (two) times daily. 01/24/22   Elson Areas, PA-C  ibuprofen (ADVIL) 600 MG tablet Take 1 tablet (600 mg total) by mouth every 6 (six) hours. 01/04/22   Roma Schanz, CNM  Prenatal Vit-Fe Fumarate-FA (MULTIVITAMIN-PRENATAL) 27-0.8 MG TABS tablet Take 1 tablet by mouth daily at 12 noon. Patient not taking: Reported on 01/10/2022    [provider]      Allergies    Patient has no known allergies.    Review of Systems   Review of Systems  Constitutional:  Negative for chills and fever.  Respiratory:  Negative for shortness of breath.   Cardiovascular:  Negative for chest pain.  Gastrointestinal:  Positive for abdominal pain, nausea and vomiting. Negative for diarrhea.  Genitourinary:  Positive for  dysuria, flank pain and hematuria.  Neurological:  Negative for headaches.    Physical Exam Updated Vital Signs BP (!) 118/58 (BP Location: Right Arm)   Pulse 95   Temp (!) 103 F (39.4 C) (Oral)   Resp 19   Ht 5\' 2"  (1.575 m)   Wt 64.4 kg   SpO2 100%   BMI 25.97 kg/m  Physical Exam Vitals and nursing note reviewed.  Constitutional:      General: She is not in acute distress.    Appearance: She is not ill-appearing.  HENT:     Head: Normocephalic and atraumatic.     Nose: No congestion.  Eyes:     Conjunctiva/sclera: Conjunctivae normal.  Cardiovascular:     Rate and Rhythm: Regular rhythm. Tachycardia present.     Pulses: Normal pulses.     Heart sounds: No murmur heard.    No friction rub. No gallop.  Pulmonary:     Effort: No respiratory distress.     Breath sounds: No wheezing, rhonchi or rales.  Abdominal:     Palpations: Abdomen is soft.     Tenderness: There is abdominal tenderness. There is right CVA tenderness. There is no left CVA tenderness.     Comments: Abdomen nondistended, soft, minimal pain in the right upper and right lower quadrant reveals right flank pain she does have positive CVA tenderness, she has no guarding rebound tenderness or peritoneal sign.  Musculoskeletal:     Right lower leg: No edema.  Left lower leg: No edema.  Skin:    General: Skin is warm and dry.  Neurological:     Mental Status: She is alert.  Psychiatric:        Mood and Affect: Mood normal.     ED Results / Procedures / Treatments   Labs (all labs ordered are listed, but only abnormal results are displayed) Labs Reviewed  CULTURE, BLOOD (ROUTINE X 2)  CULTURE, BLOOD (ROUTINE X 2)  CBC WITH DIFFERENTIAL/PLATELET  COMPREHENSIVE METABOLIC PANEL  HCG, QUANTITATIVE, PREGNANCY  LACTIC ACID, PLASMA  LACTIC ACID, PLASMA  URINALYSIS, W/ REFLEX TO CULTURE (INFECTION SUSPECTED)    EKG None  Radiology No results found.  Procedures Procedures  {Document cardiac  monitor, telemetry assessment procedure when appropriate:1}  Medications Ordered in ED Medications  acetaminophen (TYLENOL) tablet 1,000 mg (has no administration in time range)  sodium chloride 0.9 % bolus 1,000 mL (has no administration in time range)  HYDROmorphone (DILAUDID) injection 1 mg (has no administration in time range)  ondansetron (ZOFRAN) injection 4 mg (has no administration in time range)    ED Course/ Medical Decision Making/ A&P   {   Click here for ABCD2, HEART and other calculatorsREFRESH Note before signing :1}                              Medical Decision Making Risk Prescription drug management.   This patient presents to the ED for concern of flank pain, this involves an extensive number of treatment options, and is a complaint that carries with it a high risk of complications and morbidity.  The differential diagnosis includes pyelo-, kidney stone, diverticulitis, cholecystitis, pancreatitis, lower lobe pneumonia,    Additional history obtained:  Additional history obtained from N/A External records from outside source obtained and reviewed including recent ER notes   Co morbidities that complicate the patient evaluation  Kidney stones, pyelo-  Social Determinants of Health:  N/A    Lab Tests:  I Ordered, and personally interpreted labs.  The pertinent results include:  ***   Imaging Studies ordered:  I ordered imaging studies including CT abdomen pelvis with contrast I independently visualized and interpreted imaging which showed *** I agree with the radiologist interpretation   Cardiac Monitoring:  The patient was maintained on a cardiac monitor.  I personally viewed and interpreted the cardiac monitored which showed an underlying rhythm of: N/A   Medicines ordered and prescription drug management:  I ordered medication including fluids, pain medication, antiemetics, antipyretics I have reviewed the patients home medicines and  have made adjustments as needed  Critical Interventions:  ***   Reevaluation:  Presents with right sided flank pain, she is tachycardic, febrile on arrival, exam and presentation seems consistent with likely [place] versus septic stone, will obtain sepsis workup, start fluids, antibiotics, continue to monitor.    Consultations Obtained:  I requested consultation with the ***,  and discussed lab and imaging findings as well as pertinent plan - they recommend: ***    Test Considered:  ***    Rule out ****    Dispostion and problem list  After consideration of the diagnostic results and the patients response to treatment, I feel that the patent would benefit from ***.       {Document critical care time when appropriate:1} {Document review of labs and clinical decision tools ie heart score, Chads2Vasc2 etc:1}  {Document your independent review of radiology images, and any  outside records:1} {Document your discussion with family members, caretakers, and with consultants:1} {Document social determinants of health affecting pt's care:1} {Document your decision making why or why not admission, treatments were needed:1} Final Clinical Impression(s) / ED Diagnoses Final diagnoses:  None    Rx / DC Orders ED Discharge Orders     None

## 2022-10-19 NOTE — ED Triage Notes (Signed)
Patient BIB [facility] from home c/o left flank pain, n/v, blood with urinated that started today. 10/10 pain. 140/80,96%, 85.

## 2022-10-20 ENCOUNTER — Emergency Department (HOSPITAL_COMMUNITY): Payer: 59

## 2022-10-20 ENCOUNTER — Encounter (HOSPITAL_COMMUNITY): Payer: Self-pay | Admitting: Internal Medicine

## 2022-10-20 DIAGNOSIS — E162 Hypoglycemia, unspecified: Secondary | ICD-10-CM | POA: Diagnosis not present

## 2022-10-20 DIAGNOSIS — D649 Anemia, unspecified: Secondary | ICD-10-CM

## 2022-10-20 DIAGNOSIS — N10 Acute tubulo-interstitial nephritis: Secondary | ICD-10-CM | POA: Diagnosis present

## 2022-10-20 DIAGNOSIS — N12 Tubulo-interstitial nephritis, not specified as acute or chronic: Secondary | ICD-10-CM | POA: Diagnosis not present

## 2022-10-20 DIAGNOSIS — Z841 Family history of disorders of kidney and ureter: Secondary | ICD-10-CM | POA: Diagnosis not present

## 2022-10-20 DIAGNOSIS — E876 Hypokalemia: Secondary | ICD-10-CM | POA: Diagnosis not present

## 2022-10-20 DIAGNOSIS — E872 Acidosis, unspecified: Secondary | ICD-10-CM | POA: Diagnosis not present

## 2022-10-20 DIAGNOSIS — B9620 Unspecified Escherichia coli [E. coli] as the cause of diseases classified elsewhere: Secondary | ICD-10-CM | POA: Diagnosis not present

## 2022-10-20 DIAGNOSIS — R109 Unspecified abdominal pain: Secondary | ICD-10-CM | POA: Diagnosis not present

## 2022-10-20 DIAGNOSIS — Z87442 Personal history of urinary calculi: Secondary | ICD-10-CM | POA: Diagnosis not present

## 2022-10-20 DIAGNOSIS — R319 Hematuria, unspecified: Secondary | ICD-10-CM | POA: Diagnosis not present

## 2022-10-20 DIAGNOSIS — Z8249 Family history of ischemic heart disease and other diseases of the circulatory system: Secondary | ICD-10-CM | POA: Diagnosis not present

## 2022-10-20 DIAGNOSIS — Z8613 Personal history of malaria: Secondary | ICD-10-CM | POA: Diagnosis not present

## 2022-10-20 DIAGNOSIS — J45909 Unspecified asthma, uncomplicated: Secondary | ICD-10-CM | POA: Diagnosis not present

## 2022-10-20 LAB — COMPREHENSIVE METABOLIC PANEL
ALT: 15 U/L (ref 0–44)
AST: 24 U/L (ref 15–41)
Albumin: 3.8 g/dL (ref 3.5–5.0)
Alkaline Phosphatase: 45 U/L (ref 38–126)
Anion gap: 15 (ref 5–15)
BUN: 9 mg/dL (ref 6–20)
CO2: 19 mmol/L — ABNORMAL LOW (ref 22–32)
Calcium: 8.4 mg/dL — ABNORMAL LOW (ref 8.9–10.3)
Chloride: 107 mmol/L (ref 98–111)
Creatinine, Ser: 0.83 mg/dL (ref 0.44–1.00)
GFR, Estimated: >60 mL/min (ref >60)
Glucose, Bld: 58 mg/dL — ABNORMAL LOW (ref 70–99)
Potassium: 3.9 mmol/L (ref 3.5–5.1)
Sodium: 141 mmol/L (ref 135–145)
Total Bilirubin: 1.1 mg/dL (ref 0.3–1.2)
Total Protein: 6.6 g/dL (ref 6.5–8.1)

## 2022-10-20 LAB — LACTIC ACID, PLASMA
Lactic Acid, Venous: 2.4 mmol/L (ref 0.5–1.9)
Lactic Acid, Venous: 2 mmol/L (ref 0.5–1.9)

## 2022-10-20 LAB — CBG MONITORING, ED
Glucose-Capillary: 144 mg/dL — ABNORMAL HIGH (ref 70–99)
Glucose-Capillary: 48 mg/dL — ABNORMAL LOW (ref 70–99)
Glucose-Capillary: 122 mg/dL — ABNORMAL HIGH (ref 70–99)

## 2022-10-20 LAB — CBC WITH DIFFERENTIAL/PLATELET
Abs Immature Granulocytes: 0.03 10*3/uL (ref 0.00–0.07)
Basophils Absolute: 0 10*3/uL (ref 0.0–0.1)
Basophils Relative: 0 %
Eosinophils Absolute: 0 10*3/uL (ref 0.0–0.5)
Eosinophils Relative: 0 %
HCT: 37.6 % (ref 36.0–46.0)
Hemoglobin: 11.6 g/dL — ABNORMAL LOW (ref 12.0–15.0)
Immature Granulocytes: 0 %
Lymphocytes Relative: 9 %
Lymphs Abs: 0.8 10*3/uL (ref 0.7–4.0)
MCH: 30.9 pg (ref 26.0–34.0)
MCHC: 30.9 g/dL (ref 30.0–36.0)
MCV: 100 fL (ref 80.0–100.0)
Monocytes Absolute: 0.4 10*3/uL (ref 0.1–1.0)
Monocytes Relative: 5 %
Neutro Abs: 7.6 10*3/uL (ref 1.7–7.7)
Neutrophils Relative %: 86 %
Platelets: 165 10*3/uL (ref 150–400)
RBC: 3.76 MIL/uL — ABNORMAL LOW (ref 3.87–5.11)
RDW: 12.6 % (ref 11.5–15.5)
WBC: 8.9 10*3/uL (ref 4.0–10.5)
nRBC: 0 % (ref 0.0–0.2)

## 2022-10-20 LAB — IRON AND TIBC
Iron: 28 ug/dL (ref 28–170)
Saturation Ratios: 10 % — ABNORMAL LOW (ref 10.4–31.8)
TIBC: 283 ug/dL (ref 250–450)
UIBC: 255 ug/dL

## 2022-10-20 LAB — HCG, QUANTITATIVE, PREGNANCY: hCG, Beta Chain, Quant, S: 1 m[IU]/mL (ref <5)

## 2022-10-20 MED ORDER — ACETAMINOPHEN 650 MG RE SUPP: 650.0000 mg | Freq: Four times a day (QID) | RECTAL | Status: DC | PRN

## 2022-10-20 MED ORDER — SENNOSIDES-DOCUSATE SODIUM 8.6-50 MG PO TABS: 1.0000 | ORAL_TABLET | Freq: Every evening | ORAL | Status: DC | PRN

## 2022-10-20 MED ORDER — SODIUM CHLORIDE 0.9 % IV BOLUS
500.0000 mL | Freq: Once | INTRAVENOUS | Status: AC
  Administered 2022-10-20: 500 mL via INTRAVENOUS

## 2022-10-20 MED ORDER — DEXTROSE 50 % IV SOLN
1.0000 | Freq: Once | INTRAVENOUS | Status: AC
  Administered 2022-10-20: 50 mL via INTRAVENOUS
  Filled 2022-10-20: qty 50

## 2022-10-20 MED ORDER — TRAMADOL HCL 50 MG PO TABS
50.0000 mg | ORAL_TABLET | Freq: Four times a day (QID) | ORAL | Status: DC | PRN
  Administered 2022-10-20 – 2022-10-21 (×2): 50 mg via ORAL
  Filled 2022-10-20 (×2): qty 1

## 2022-10-20 MED ORDER — GUAIFENESIN 100 MG/5ML PO LIQD: 5.0000 mL | ORAL | Status: DC | PRN

## 2022-10-20 MED ORDER — PROCHLORPERAZINE EDISYLATE 10 MG/2ML IJ SOLN
10.0000 mg | Freq: Once | INTRAMUSCULAR | Status: AC
  Administered 2022-10-20: 10 mg via INTRAVENOUS
  Filled 2022-10-20: qty 2

## 2022-10-20 MED ORDER — SODIUM CHLORIDE 0.9 % IV SOLN
1.0000 g | Freq: Once | INTRAVENOUS | Status: AC
  Administered 2022-10-20: 1 g via INTRAVENOUS
  Filled 2022-10-20: qty 10

## 2022-10-20 MED ORDER — SODIUM CHLORIDE 0.9 % IV SOLN
2.0000 g | INTRAVENOUS | Status: DC
  Administered 2022-10-21 – 2022-10-22 (×2): 2 g via INTRAVENOUS
  Filled 2022-10-20 (×2): qty 20

## 2022-10-20 MED ORDER — DOCUSATE SODIUM 100 MG PO CAPS: 200.0000 mg | ORAL_CAPSULE | Freq: Two times a day (BID) | ORAL | Status: DC | PRN

## 2022-10-20 MED ORDER — KETOROLAC TROMETHAMINE 30 MG/ML IJ SOLN
30.0000 mg | Freq: Once | INTRAMUSCULAR | Status: AC
  Administered 2022-10-20: 30 mg via INTRAVENOUS
  Filled 2022-10-20: qty 1

## 2022-10-20 MED ORDER — IOHEXOL 350 MG/ML SOLN
75.0000 mL | Freq: Once | INTRAVENOUS | Status: AC | PRN
  Administered 2022-10-20: 75 mL via INTRAVENOUS

## 2022-10-20 MED ORDER — HYDRALAZINE HCL 20 MG/ML IJ SOLN: 10.0000 mg | INTRAMUSCULAR | Status: DC | PRN

## 2022-10-20 MED ORDER — KETOROLAC TROMETHAMINE 15 MG/ML IJ SOLN
15.0000 mg | Freq: Four times a day (QID) | INTRAMUSCULAR | Status: DC | PRN
  Administered 2022-10-20 – 2022-10-22 (×3): 15 mg via INTRAVENOUS
  Filled 2022-10-20 (×3): qty 1

## 2022-10-20 MED ORDER — DEXTROSE IN LACTATED RINGERS 5 % IV SOLN: INTRAVENOUS | Status: DC

## 2022-10-20 MED ORDER — OXYCODONE HCL 5 MG PO TABS
5.0000 mg | ORAL_TABLET | ORAL | Status: DC | PRN
  Administered 2022-10-20 – 2022-10-21 (×4): 5 mg via ORAL
  Filled 2022-10-20 (×4): qty 1

## 2022-10-20 MED ORDER — ACETAMINOPHEN 325 MG PO TABS
650.0000 mg | ORAL_TABLET | Freq: Four times a day (QID) | ORAL | Status: DC | PRN
  Administered 2022-10-20 – 2022-10-21 (×2): 650 mg via ORAL
  Filled 2022-10-20 (×2): qty 2

## 2022-10-20 MED ORDER — SODIUM CHLORIDE 0.9 % IV SOLN: 1.0000 g | INTRAVENOUS | Status: DC

## 2022-10-20 MED ORDER — ONDANSETRON HCL 4 MG/2ML IJ SOLN
4.0000 mg | Freq: Four times a day (QID) | INTRAMUSCULAR | Status: DC | PRN
  Administered 2022-10-20 – 2022-10-21 (×2): 4 mg via INTRAVENOUS
  Filled 2022-10-20 (×2): qty 2

## 2022-10-20 MED ORDER — DEXTROSE IN LACTATED RINGERS 5 % IV SOLN: INTRAVENOUS | Status: AC

## 2022-10-20 MED ORDER — HYDROMORPHONE HCL 1 MG/ML IJ SOLN
1.0000 mg | Freq: Once | INTRAMUSCULAR | Status: AC
  Administered 2022-10-20: 1 mg via INTRAVENOUS
  Filled 2022-10-20: qty 1

## 2022-10-20 MED ORDER — TRAZODONE HCL 50 MG PO TABS
50.0000 mg | ORAL_TABLET | Freq: Every evening | ORAL | Status: DC | PRN
  Administered 2022-10-20: 50 mg via ORAL
  Filled 2022-10-20: qty 1

## 2022-10-20 NOTE — Progress Notes (Signed)
Transition of Care [facility]) - Inpatient Brief Assessment   Patient Details  Name: Taylor Ramsey MRN: [id number] Date of Birth: 09-Apr-2000  Transition of Care [facility]) CM/SW Contact:    Larrie Kass, LCSW Phone Number: 10/20/2022, 1:54 PM   Clinical Narrative:  Transition of Care Department [facility]) has reviewed patient and no TOC needs have been identified at this time. We will continue to monitor patient advancement through interdisciplinary progression rounds. If new patient transition needs arise, please place a TOC consult.  Transition of Care Asessment: Insurance and Status: Insurance coverage has been reviewed Patient has primary care physician: Yes Home environment has been reviewed: yes Prior level of function:: independent Prior/Current Home Services: No current home services Social Determinants of Health Reivew: SDOH reviewed no interventions necessary Readmission risk has been reviewed: Yes Transition of care needs: no transition of care needs at this time

## 2022-10-20 NOTE — ED Notes (Signed)
Pt provided with [place] juice.

## 2022-10-20 NOTE — Plan of Care (Signed)
  Problem: Education: Goal: Knowledge of General Education information will improve Description: Including pain rating scale, medication(s)/side effects and non-pharmacologic comfort measures Outcome: Completed/Met

## 2022-10-20 NOTE — H&P (Signed)
History and Physical    Kamali Carmouche ZOX:[id number] DOB: [birth year] DOA: 10/19/2022  DOS: the patient was seen and examined on 10/19/2022  PCP: [facility]   Patient coming from: Home  I have personally briefly reviewed patient's old medical records in [facility]  CC: left flank pain, N/V HPI: 22 year old African-American female without significant medical history presents today with a history of flank pain, 1 day history of nausea and vomiting, 1 day history of fever.  Patient's had some hematuria.  Patient presented to the ER due to flank pain.  On arrival temp 100.3 heart rate 95 blood pressure 118/58 satting high percent on room air.  Patient noted to be hypoglycemic with a blood sugar of 58.  She was given amp of D50.  Labs showed a sodium 141, potassium 3.9, chloride of 107, bicarb 19, glucose 58, BUN 9, creatinine 0.8, calcium 8.4, total protein 6.6, albumin 3.8, AST 24, ALT of 15, alk phos of 45, total bili 1.1  White count 8.9, hemoglobin 11.6, platelets 165  Lactic acid is elevated 2.4  CT abdomen pelvis demonstrated patchy hypoenhancement of the right kidney.  There was no renal calculus or obstructive uropathy.  There is perinephric fat stranding.  Urine culture was obtained.  Patient given IV Rocephin.  [facility] hospitalist consulted.   ED Course: febrile to 103. Hypoglycemic to 58. Given D50. CT shows right pyelonpehritis  Review of Systems:  Review of Systems  Constitutional:  Positive for malaise/fatigue. Negative for fever.  HENT: Negative.    Eyes: Negative.   Respiratory: Negative.    Cardiovascular: Negative.   Gastrointestinal:  Positive for abdominal pain, nausea and vomiting.  Genitourinary:  Positive for flank pain and hematuria.  Skin: Negative.   Neurological: Negative.   Endo/Heme/Allergies: Negative.        Anorexia  Psychiatric/Behavioral: Negative.    All other systems reviewed and are negative.   Past  Medical History:  Diagnosis Date   Acute metabolic encephalopathy 01/12/2022   Acute pyelonephritis 01/11/2022   Asthma    Bilateral hydronephrosis 01/12/2022   Hypokalemia 01/12/2022   Hypomagnesemia 01/12/2022   Malaria 2018   Normal labor 01/02/2022   Right ureteral stone 01/12/2022   Sepsis (HCC) 01/12/2022   Status post surgery 01/11/2022   Vaginal delivery 01/02/2022   Vitamin D deficiency     Past Surgical History:  Procedure Laterality Date   CYSTOSCOPY W/ URETERAL STENT PLACEMENT Right 01/11/2022   Procedure: CYSTOSCOPY WITH RETROGRADE PYELOGRAM/URETERAL STENT PLACEMENT;  Surgeon: Crista Elliot, MD;  Location: [facility] OR;  Service: Urology;  Laterality: Right;   NO PAST SURGERIES       reports that she has never smoked. She has never used smokeless tobacco. She reports current drug use. Drug: Marijuana. She reports that she does not drink alcohol.  No Known Allergies  Family History  Problem Relation Age of Onset   Hypertension Father    Kidney disease Father     Prior to Admission medications   Medication Sig Start Date End Date Taking? Authorizing Provider  ibuprofen (ADVIL) 200 MG tablet Take 400 mg by mouth 2 (two) times daily as needed for moderate pain.   Yes [provider]    Physical Exam: Vitals:   10/20/22 0400 10/20/22 0445 10/20/22 0530 10/20/22 0545  BP: 113/75 114/61 119/74 119/81  Pulse: 75  (!) 115 (!) 118  Resp: 16  (!) 26 20  Temp:      TempSrc:  SpO2: 100%  100% 100%  Weight:      Height:        Physical Exam Vitals and nursing note reviewed.  Constitutional:      General: She is not in acute distress.    Appearance: She is normal weight. She is not toxic-appearing or diaphoretic.  HENT:     Head: Normocephalic and atraumatic.     Nose: Nose normal.  Eyes:     General: No scleral icterus. Cardiovascular:     Rate and Rhythm: Normal rate and regular rhythm.     Pulses: Normal pulses.  Pulmonary:     Effort:  Pulmonary effort is normal.     Breath sounds: Normal breath sounds.  Abdominal:     General: Bowel sounds are normal. There is no distension.     Tenderness: There is no abdominal tenderness. There is right CVA tenderness. There is no left CVA tenderness.  Musculoskeletal:     Right lower leg: No edema.     Left lower leg: No edema.  Skin:    General: Skin is warm and dry.     Capillary Refill: Capillary refill takes less than 2 seconds.  Neurological:     General: No focal deficit present.     Mental Status: She is alert and oriented to person, place, and time.      Labs on Admission: I have personally reviewed following labs and imaging studies  CBC: Recent Labs  Lab 10/20/22 0100  WBC 8.9  NEUTROABS 7.6  HGB 11.6*  HCT 37.6  MCV 100.0  PLT 165   Basic Metabolic Panel: Recent Labs  Lab 10/20/22 0100  NA 141  K 3.9  CL 107  CO2 19*  GLUCOSE 58*  BUN 9  CREATININE 0.83  CALCIUM 8.4*   GFR: Estimated Creatinine Clearance: 95.2 mL/min (by C-G formula based on SCr of 0.83 mg/dL). Liver Function Tests: Recent Labs  Lab 10/20/22 0100  AST 24  ALT 15  ALKPHOS 45  BILITOT 1.1  PROT 6.6  ALBUMIN 3.8   CBG: Recent Labs  Lab 10/20/22 0341 10/20/22 0426 10/20/22 0459  GLUCAP 48* 144* 122*   Urine analysis:    Component Value Date/Time   COLORURINE YELLOW 10/19/2022 2346   APPEARANCEUR CLEAR 10/19/2022 2346   LABSPEC 1.014 10/19/2022 2346   PHURINE 8.0 10/19/2022 2346   GLUCOSEU NEGATIVE 10/19/2022 2346   HGBUR NEGATIVE 10/19/2022 2346   BILIRUBINUR NEGATIVE 10/19/2022 2346   KETONESUR 20 (A) 10/19/2022 2346   PROTEINUR 30 (A) 10/19/2022 2346   UROBILINOGEN 0.2 09/10/2013 2247   NITRITE NEGATIVE 10/19/2022 2346   LEUKOCYTESUR SMALL (A) 10/19/2022 2346    Radiological Exams on Admission: I have personally reviewed images CT ABDOMEN PELVIS W CONTRAST  Result Date: 10/20/2022 CLINICAL DATA:  Pyelonephritis suspected, flank pain and nausea. EXAM:  CT ABDOMEN AND PELVIS WITH CONTRAST TECHNIQUE: Multidetector CT imaging of the abdomen and pelvis was performed using the standard protocol following bolus administration of intravenous contrast. RADIATION DOSE REDUCTION: This exam was performed according to the departmental dose-optimization program which includes automated exposure control, adjustment of the mA and/or kV according to patient size and/or use of iterative reconstruction technique. CONTRAST:  75mL OMNIPAQUE IOHEXOL 350 MG/ML SOLN COMPARISON:  01/24/2022. FINDINGS: Lower chest: Atelectasis is present in the lower lobes bilaterally. Hepatobiliary: Hypodense region is present in the left lobe of the liver adjacent to the falciform ligament, possible focal fatty infiltration. No biliary ductal dilatation. The gallbladder is without stones.  Pancreas: Unremarkable. No pancreatic ductal dilatation or surrounding inflammatory changes. Spleen: Normal in size without focal abnormality. Adrenals/Urinary Tract: The adrenal glands are within normal limits. Mild patchy hypoenhancement is noted in the right kidney. No renal calculus or obstructive uropathy bilaterally. There is urothelial enhancement on the right with perinephric fat stranding. The bladder is unremarkable. Stomach/Bowel: Stomach is within normal limits. Appendix appears normal. No evidence of bowel wall thickening, distention, or inflammatory changes. No free air or pneumatosis. Vascular/Lymphatic: No significant vascular findings are present. No enlarged abdominal or pelvic lymph nodes. Reproductive: IUD is present in the uterus.  No adnexal mass. Other: Trace amount of free fluid is noted in the cul-de-sac. Musculoskeletal: No acute osseous abnormality. IMPRESSION: Patchy hypoenhancement of the right kidney and urothelial enhancement on the right with surrounding fat stranding, compatible with pyelonephritis/ureteritis. No renal calculus or obstructive uropathy. Electronically Signed   By: Thornell Sartorius M.D.   On: 10/20/2022 02:33    EKG: My personal interpretation of EKG shows: no EKG to review  Assessment/Plan Principal Problem:   Pyelonephritis of right kidney Active Problems:   Hypoglycemia   Normocytic anemia   Assessment and Plan: * Pyelonephritis of right kidney Observation med/surg bed. Continue IV rocephin. Urine cx sent. Prn toradol for pain. Interesting that pt c/o of left flank pain but has right pyelo on CT. Repeat labs in AM.  Hypoglycemia No hx of diabetes. Likely due to poor po intake. Continue with D5LR @ 100 ml/hr  Normocytic anemia Check iron profile.   DVT prophylaxis: SCDs Code Status: Full Code Family Communication: no family at bedside  Disposition Plan: return home  Consults called: none  Admission status: Observation, Med-Surg   Carollee Herter, DO [facility] 10/20/2022, 5:50 AM

## 2022-10-20 NOTE — ED Notes (Signed)
Patient transported to CT 

## 2022-10-20 NOTE — Hospital Course (Addendum)
22-yof w/ no significant medical history presented to ED 8/19 W/ history of flank pain, 1 day history of nausea and vomiting, 1 day history of fever. She had some hematuria. In ED: Temp 100.3 heart rate 95 blood pressure 118/58 satting high percent on room air. Cbg:58, was given amp of D50.Labs showed a sodium 141, potassium 3.9, chloride of 107, bicarb 19,creatinine 0.8, calcium 8.4, total protein 6.6, albumin 3.8, AST 24, ALT of 15, alk phos of 45, total bili 1.1White count 8.9, hemoglobin 11.6, platelets 165. Lactic acid is elevated 2.4 CT abdomen pelvis>> PAtchy hypoenhancement of the right kidney.There was no renal calculus or obstructive uropathy.  There is perinephric fat stranding. Urine culture was obtained.  Patient given IV Rocephin and admitted for further management for right pyelonephritis.

## 2022-10-20 NOTE — ED Notes (Signed)
Creatinine of 1.09 confirmed by Lab Gunnar Fusi) Cleared to go to CT by Rudi Coco MD

## 2022-10-20 NOTE — ED Notes (Signed)
Called CT to inform them that PT was cleared to come down. They stated they would send for her in a minute

## 2022-10-20 NOTE — Assessment & Plan Note (Signed)
Check iron profile.

## 2022-10-20 NOTE — Assessment & Plan Note (Signed)
No hx of diabetes. Likely due to poor po intake. Continue with D5LR @ 100 ml/hr

## 2022-10-20 NOTE — Assessment & Plan Note (Addendum)
Observation med/surg bed. Continue IV rocephin. Urine cx sent. Prn toradol for pain. Interesting that pt c/o of left flank pain but has right pyelo on CT. Repeat labs in AM.

## 2022-10-20 NOTE — Subjective & Objective (Signed)
CC: left flank pain, N/V HPI: 22 year old African-American female without significant medical history presents today with a history of flank pain, 1 day history of nausea and vomiting, 1 day history of fever.  Patient's had some hematuria.  Patient presented to the ER due to flank pain.  On arrival temp 100.3 heart rate 95 blood pressure 118/58 satting high percent on room air.  Patient noted to be hypoglycemic with a blood sugar of 58.  She was given amp of D50.  Labs showed a sodium 141, potassium 3.9, chloride of 107, bicarb 19, glucose 58, BUN 9, creatinine 0.8, calcium 8.4, total protein 6.6, albumin 3.8, AST 24, ALT of 15, alk phos of 45, total bili 1.1  White count 8.9, hemoglobin 11.6, platelets 165  Lactic acid is elevated 2.4  CT abdomen pelvis demonstrated patchy hypoenhancement of the right kidney.  There was no renal calculus or obstructive uropathy.  There is perinephric fat stranding.  Urine culture was obtained.  Patient given IV Rocephin.  [facility] hospitalist consulted.

## 2022-10-20 NOTE — Progress Notes (Addendum)
Patient seen and examined personally, I reviewed the chart, history and physical and admission note, done by admitting physician this morning and agree with the same with following addendum.  Please refer to the morning admission note for more detailed plan of care.  Briefly,  22-yof w/ no significant medical history presented to ED 8/19 W/ history of flank pain, 1 day history of nausea and vomiting, 1 day history of fever. She had some hematuria. In ED: Temp 100.3 heart rate 95 blood pressure 118/58 satting high percent on room air. Cbg:58, was given amp of D50.Labs showed a sodium 141, potassium 3.9, chloride of 107, bicarb 19,creatinine 0.8, calcium 8.4, total protein 6.6, albumin 3.8, AST 24, ALT of 15, alk phos of 45, total bili 1.1White count 8.9, hemoglobin 11.6, platelets 165. Lactic acid is elevated 2.4 CT abdomen pelvis>> PAtchy hypoenhancement of the right kidney.There was no renal calculus or obstructive uropathy.  There is perinephric fat stranding. Urine culture was obtained.  Patient given IV Rocephin and admitted for further management for right pyelonephritis.  A/P: Pyelonephritis of the right kidney Lactic acidosis: Met SIRS criteria with fever, tachycardia source pyonephritis suspected  source is pyelonephritis POA. Patient had received bolus fluid 1 L in the ED, recheck lactic acid if elevated will rebolus  Remains mildly tachycardic BP stable. Has some flank pain on bilateral side MORE ON RT Repeat lactic acid has been ordered nursing has been notified. Blood cx not ordered on admission-ordered now, but post antibiotics/ rocephin increased to 2 gm. Recent Labs  Lab 10/20/22 0100  WBC 8.9  LATICACIDVEN 2.4*    Hypoglycemia: Resolved with treatment, continue diet  Normocytic anemia: CheckIing  iron profile

## 2022-10-20 NOTE — ED Notes (Signed)
PT has hard veins to get blood from.IV team even tried but was unsuccessful,will consult phlebotomy.

## 2022-10-20 NOTE — ED Notes (Signed)
PT was difficult stick for lab draws, IV team ,RN and phlebotomy were only able to obtain minimal blood from her. Rudi Coco PA was ok with skipping the blood cultures .

## 2022-10-21 DIAGNOSIS — N12 Tubulo-interstitial nephritis, not specified as acute or chronic: Secondary | ICD-10-CM | POA: Diagnosis not present

## 2022-10-21 LAB — CBC WITH DIFFERENTIAL/PLATELET
Abs Immature Granulocytes: 0.05 10*3/uL (ref 0.00–0.07)
Basophils Absolute: 0 10*3/uL (ref 0.0–0.1)
Basophils Relative: 0 %
Eosinophils Absolute: 0 10*3/uL (ref 0.0–0.5)
Eosinophils Relative: 0 %
HCT: 31.4 % — ABNORMAL LOW (ref 36.0–46.0)
Hemoglobin: 10.1 g/dL — ABNORMAL LOW (ref 12.0–15.0)
Immature Granulocytes: 0 %
Lymphocytes Relative: 5 %
Lymphs Abs: 0.6 10*3/uL — ABNORMAL LOW (ref 0.7–4.0)
MCH: 31.9 pg (ref 26.0–34.0)
MCHC: 32.2 g/dL (ref 30.0–36.0)
MCV: 99.1 fL (ref 80.0–100.0)
Monocytes Absolute: 0.8 10*3/uL (ref 0.1–1.0)
Monocytes Relative: 6 %
Neutro Abs: 10.9 10*3/uL — ABNORMAL HIGH (ref 1.7–7.7)
Neutrophils Relative %: 89 %
Platelets: 159 10*3/uL (ref 150–400)
RBC: 3.17 MIL/uL — ABNORMAL LOW (ref 3.87–5.11)
RDW: 12.5 % (ref 11.5–15.5)
WBC: 12.3 10*3/uL — ABNORMAL HIGH (ref 4.0–10.5)
nRBC: 0 % (ref 0.0–0.2)

## 2022-10-21 LAB — GLUCOSE, CAPILLARY: Glucose-Capillary: 116 mg/dL — ABNORMAL HIGH (ref 70–99)

## 2022-10-21 LAB — MAGNESIUM: Magnesium: 1.7 mg/dL (ref 1.7–2.4)

## 2022-10-21 LAB — COMPREHENSIVE METABOLIC PANEL
ALT: 13 U/L (ref 0–44)
AST: 13 U/L — ABNORMAL LOW (ref 15–41)
Albumin: 3 g/dL — ABNORMAL LOW (ref 3.5–5.0)
Alkaline Phosphatase: 42 U/L (ref 38–126)
Anion gap: 9 (ref 5–15)
BUN: <5 mg/dL — ABNORMAL LOW (ref 6–20)
CO2: 21 mmol/L — ABNORMAL LOW (ref 22–32)
Calcium: 8.1 mg/dL — ABNORMAL LOW (ref 8.9–10.3)
Chloride: 105 mmol/L (ref 98–111)
Creatinine, Ser: 0.54 mg/dL (ref 0.44–1.00)
GFR, Estimated: >60 mL/min (ref >60)
Glucose, Bld: 125 mg/dL — ABNORMAL HIGH (ref 70–99)
Potassium: 2.9 mmol/L — ABNORMAL LOW (ref 3.5–5.1)
Sodium: 135 mmol/L (ref 135–145)
Total Bilirubin: 0.4 mg/dL (ref 0.3–1.2)
Total Protein: 5.9 g/dL — ABNORMAL LOW (ref 6.5–8.1)

## 2022-10-21 LAB — PHOSPHORUS: Phosphorus: 2 mg/dL — ABNORMAL LOW (ref 2.5–4.6)

## 2022-10-21 MED ORDER — MAGNESIUM SULFATE 2 GM/50ML IV SOLN
2.0000 g | Freq: Once | INTRAVENOUS | Status: AC
  Administered 2022-10-21: 2 g via INTRAVENOUS
  Filled 2022-10-21: qty 50

## 2022-10-21 MED ORDER — POTASSIUM CHLORIDE CRYS ER 20 MEQ PO TBCR
40.0000 meq | EXTENDED_RELEASE_TABLET | Freq: Two times a day (BID) | ORAL | Status: AC
  Administered 2022-10-21 (×2): 40 meq via ORAL
  Filled 2022-10-21 (×2): qty 2

## 2022-10-21 MED ORDER — HYDROMORPHONE HCL 1 MG/ML IJ SOLN
0.5000 mg | Freq: Once | INTRAMUSCULAR | Status: AC | PRN
  Administered 2022-10-21: 0.5 mg via INTRAVENOUS
  Filled 2022-10-21: qty 0.5

## 2022-10-21 MED ORDER — POTASSIUM PHOSPHATES 15 MMOLE/5ML IV SOLN
20.0000 mmol | Freq: Once | INTRAVENOUS | Status: AC
  Administered 2022-10-21: 20 mmol via INTRAVENOUS
  Filled 2022-10-21: qty 6.67

## 2022-10-21 MED ORDER — HYDROMORPHONE HCL 1 MG/ML IJ SOLN
0.5000 mg | INTRAMUSCULAR | Status: AC | PRN
  Administered 2022-10-21 – 2022-10-22 (×5): 0.5 mg via INTRAVENOUS
  Filled 2022-10-21 (×5): qty 0.5

## 2022-10-21 NOTE — Progress Notes (Signed)
PROGRESS NOTE    Taylor Ramsey  ZOX:[id number] DOB: 02/25/2001 DOA: 10/19/2022 PCP: [facility]    Brief Narrative:  22 year old no significant medical history presented with sudden onset of nausea vomiting right flank pain and fever for 1 day.  Also had some hematuria.  Temperature 100.3, heart rate 95 and blood pressure stable on the ER.  Hypoglycemic and noted blood sugar 58.  WC count 8.9.  Lactic acid 2.4.  Urine analysis abnormal.  CT scan with right kidney abnormalities.  Admitted with acute pyelonephritis.   Assessment & Plan:   Pyelonephritis of the right kidney, SIRS criteria met with fever, tachycardia. Still febrile.  Blood cultures were collected after first dose of antibiotics.  Negative so far.  Urine cultures growing more than 100,000 colonies of gram-negative.  Probably E. coli. Continue Rocephin pending final cultures and clinical improvement.  If she spikes another fever, will repeat blood cultures.  Lactic acid trending down.  Hypokalemia/hypophosphatemia: Replace aggressively.    Hypomagnesemia: Replace 2 g magnesium rider today.  Recheck levels.  Hypoglycemia: Due to poor oral intake.  Currently stabilized.    DVT prophylaxis: SCDs Start: 10/20/22 1227   Code Status: Full code Family Communication: Mother at the bedside Disposition Plan: Status is: Inpatient Remains inpatient appropriate because: IV antibiotics     Consultants:  None  Procedures:  None  Antimicrobials:  Rocephin 8/19---   Subjective: Patient seen and examined.  Temperature 102 last 24 hours.  She still has right flank pain which is worse with movement.  Urinating without problem.  Mother at the bedside.  4-month-old child remained with her overnight.  She does not want to use any oral pain medication because they do not work.  She wants to use a small dose of injectable pain medication.  Objective: Vitals:   10/21/22 0057 10/21/22 0614 10/21/22 0828  10/21/22 1316  BP: 136/77 102/64  110/83  Pulse: 84 80  78  Resp: 20 17  18   Temp: 99 F (37.2 C) 99.5 F (37.5 C) 99.4 F (37.4 C) 97.8 F (36.6 C)  TempSrc: Axillary Oral Oral Oral  SpO2: 91% 98%  92%  Weight:      Height:        Intake/Output Summary (Last 24 hours) at 10/21/2022 1547 Last data filed at 10/21/2022 1420 Gross per 24 hour  Intake 192.48 ml  Output --  Net 192.48 ml   Filed Weights   10/19/22 2302  Weight: 64.4 kg    Examination:  General exam: Appears calm and comfortable  Respiratory system: No added sounds. Cardiovascular system: S1 & S2 heard,  Gastrointestinal system: Soft.  Nondistended.  Diffuse pain right flank. Central nervous system: Alert and oriented. No focal neurological deficits. Extremities: Symmetric 5 x 5 power. Skin: No rashes, lesions or ulcers Psychiatry: Judgement and insight appear normal. Mood & affect appropriate.     Data Reviewed: I have personally reviewed following labs and imaging studies  CBC: Recent Labs  Lab 10/20/22 0100 10/21/22 0420  WBC 8.9 12.3*  NEUTROABS 7.6 10.9*  HGB 11.6* 10.1*  HCT 37.6 31.4*  MCV 100.0 99.1  PLT 165 159   Basic Metabolic Panel: Recent Labs  Lab 10/20/22 0100 10/21/22 0420  NA 141 135  K 3.9 2.9*  CL 107 105  CO2 19* 21*  GLUCOSE 58* 125*  BUN 9 <5*  CREATININE 0.83 0.54  CALCIUM 8.4* 8.1*  MG  --  1.7  PHOS  --  2.0*  GFR: Estimated Creatinine Clearance: 98.8 mL/min (by C-G formula based on SCr of 0.54 mg/dL). Liver Function Tests: Recent Labs  Lab 10/20/22 0100 10/21/22 0420  AST 24 13*  ALT 15 13  ALKPHOS 45 42  BILITOT 1.1 0.4  PROT 6.6 5.9*  ALBUMIN 3.8 3.0*   No results for input(s): "LIPASE", "AMYLASE" in the last 168 hours. No results for input(s): "AMMONIA" in the last 168 hours. Coagulation Profile: No results for input(s): "INR", "PROTIME" in the last 168 hours. Cardiac Enzymes: No results for input(s): "CKTOTAL", "CKMB", "CKMBINDEX",  "TROPONINI" in the last 168 hours. BNP (last 3 results) No results for input(s): "PROBNP" in the last 8760 hours. HbA1C: No results for input(s): "HGBA1C" in the last 72 hours. CBG: Recent Labs  Lab 10/20/22 0341 10/20/22 0426 10/20/22 0459 10/21/22 0200  GLUCAP 48* 144* 122* 116*   Lipid Profile: No results for input(s): "CHOL", "HDL", "LDLCALC", "TRIG", "CHOLHDL", "LDLDIRECT" in the last 72 hours. Thyroid Function Tests: No results for input(s): "TSH", "T4TOTAL", "FREET4", "T3FREE", "THYROIDAB" in the last 72 hours. Anemia Panel: Recent Labs    10/20/22 1343  TIBC 283  IRON 28   Sepsis Labs: Recent Labs  Lab 10/20/22 0100 10/20/22 1142  LATICACIDVEN 2.4* 2.0*    Recent Results (from the past 240 hour(s))  Urine Culture     Status: Abnormal (Preliminary result)   Collection Time: 10/19/22 11:46 PM   Specimen: Urine, Random  Result Value Ref Range Status   Specimen Description URINE, RANDOM  Final   Special Requests NONE Reflexed from [id number]  Final   Culture (A)  Final    >=100,000 COLONIES/mL ESCHERICHIA COLI SUSCEPTIBILITIES TO FOLLOW Performed at [facility], [address], [place], [place] [address]    Report Status PENDING  Incomplete  Culture, blood (Routine X 2) w Reflex to ID Panel     Status: None (Preliminary result)   Collection Time: 10/20/22  2:26 PM   Specimen: BLOOD LEFT HAND  Result Value Ref Range Status   Specimen Description   Final    BLOOD LEFT HAND Performed at [facility], [address], [place], [place] [address]    Special Requests   Final    BOTTLES DRAWN AEROBIC AND ANAEROBIC Blood Culture adequate volume Performed at [facility], [address], [place], [place] [address]    Culture   Final    NO GROWTH < 24 HOURS Performed at [facility], [address], [place], [place] [address]    Report Status PENDING  Incomplete  Culture, blood (Routine X 2) w Reflex to ID Panel     Status: None  (Preliminary result)   Collection Time: 10/20/22  2:30 PM   Specimen: BLOOD  Result Value Ref Range Status   Specimen Description   Final    BLOOD SITE NOT SPECIFIED Performed at [facility], [address] [place], [place] [address]    Special Requests   Final    BOTTLES DRAWN AEROBIC AND ANAEROBIC Blood Culture adequate volume Performed at [facility], [address], [place], [place] [address]    Culture   Final    NO GROWTH < 24 HOURS Performed at [facility], [address], [place], [place] [address]    Report Status PENDING  Incomplete         Radiology Studies: CT ABDOMEN PELVIS W CONTRAST  Result Date: 10/20/2022 CLINICAL DATA:  Pyelonephritis suspected, flank  pain and nausea. EXAM: CT ABDOMEN AND PELVIS WITH CONTRAST TECHNIQUE: Multidetector CT imaging of the abdomen and pelvis was performed using the standard protocol following bolus administration of intravenous contrast. RADIATION DOSE REDUCTION: This exam was performed according to the departmental dose-optimization program which includes automated exposure control, adjustment of the mA and/or kV according to patient size and/or use of iterative reconstruction technique. CONTRAST:  75mL OMNIPAQUE IOHEXOL 350 MG/ML SOLN COMPARISON:  01/24/2022. FINDINGS: Lower chest: Atelectasis is present in the lower lobes bilaterally. Hepatobiliary: Hypodense region is present in the left lobe of the liver adjacent to the falciform ligament, possible focal fatty infiltration. No biliary ductal dilatation. The gallbladder is without stones. Pancreas: Unremarkable. No pancreatic ductal dilatation or surrounding inflammatory changes. Spleen: Normal in size without focal abnormality. Adrenals/Urinary Tract: The adrenal glands are within normal limits. Mild patchy hypoenhancement is noted in the right kidney. No renal calculus or obstructive uropathy bilaterally. There is urothelial enhancement on the right with  perinephric fat stranding. The bladder is unremarkable. Stomach/Bowel: Stomach is within normal limits. Appendix appears normal. No evidence of bowel wall thickening, distention, or inflammatory changes. No free air or pneumatosis. Vascular/Lymphatic: No significant vascular findings are present. No enlarged abdominal or pelvic lymph nodes. Reproductive: IUD is present in the uterus.  No adnexal mass. Other: Trace amount of free fluid is noted in the cul-de-sac. Musculoskeletal: No acute osseous abnormality. IMPRESSION: Patchy hypoenhancement of the right kidney and urothelial enhancement on the right with surrounding fat stranding, compatible with pyelonephritis/ureteritis. No renal calculus or obstructive uropathy. Electronically Signed   By: Thornell Sartorius M.D.   On: 10/20/2022 02:33        Scheduled Meds:  potassium chloride  40 mEq Oral BID   Continuous Infusions:  cefTRIAXone (ROCEPHIN)  IV 2 g (10/21/22 0823)   magnesium sulfate bolus IVPB     potassium PHOSPHATE IVPB (in mmol) 20 mmol (10/21/22 1218)     LOS: 1 day    Time spent: 35 minutes    Dorcas Carrow, MD [facility]

## 2022-10-21 NOTE — Plan of Care (Signed)
?  Problem: Clinical Measurements: ?Goal: Will remain free from infection ?Outcome: Progressing ?Goal: Diagnostic test results will improve ?Outcome: Progressing ?Goal: Respiratory complications will improve ?Outcome: Progressing ?  ?

## 2022-10-22 DIAGNOSIS — N12 Tubulo-interstitial nephritis, not specified as acute or chronic: Secondary | ICD-10-CM | POA: Diagnosis not present

## 2022-10-22 LAB — CBC
HCT: 33 % — ABNORMAL LOW (ref 36.0–46.0)
Hemoglobin: 10.2 g/dL — ABNORMAL LOW (ref 12.0–15.0)
MCH: 30.7 pg (ref 26.0–34.0)
MCHC: 30.9 g/dL (ref 30.0–36.0)
MCV: 99.4 fL (ref 80.0–100.0)
Platelets: 171 10*3/uL (ref 150–400)
RBC: 3.32 MIL/uL — ABNORMAL LOW (ref 3.87–5.11)
RDW: 12.6 % (ref 11.5–15.5)
WBC: 9.3 10*3/uL (ref 4.0–10.5)
nRBC: 0 % (ref 0.0–0.2)

## 2022-10-22 LAB — MAGNESIUM: Magnesium: 2.1 mg/dL (ref 1.7–2.4)

## 2022-10-22 LAB — URINE CULTURE: Culture: >=100000 — AB

## 2022-10-22 LAB — BASIC METABOLIC PANEL
Anion gap: 9 (ref 5–15)
BUN: <5 mg/dL — ABNORMAL LOW (ref 6–20)
CO2: 21 mmol/L — ABNORMAL LOW (ref 22–32)
Calcium: 8.6 mg/dL — ABNORMAL LOW (ref 8.9–10.3)
Chloride: 106 mmol/L (ref 98–111)
Creatinine, Ser: 0.52 mg/dL (ref 0.44–1.00)
GFR, Estimated: >60 mL/min (ref >60)
Glucose, Bld: 81 mg/dL (ref 70–99)
Potassium: 4.6 mmol/L (ref 3.5–5.1)
Sodium: 136 mmol/L (ref 135–145)

## 2022-10-22 MED ORDER — OXYCODONE HCL 5 MG PO TABS: 5.0000 mg | ORAL_TABLET | ORAL | 0 refills | Status: AC | PRN

## 2022-10-22 MED ORDER — CEPHALEXIN 500 MG PO CAPS: 500.0000 mg | ORAL_CAPSULE | Freq: Three times a day (TID) | ORAL | 0 refills | Status: AC

## 2022-10-22 MED ORDER — ACETAMINOPHEN 325 MG PO TABS: 650.0000 mg | ORAL_TABLET | Freq: Four times a day (QID) | ORAL | Status: AC | PRN

## 2022-10-22 NOTE — Discharge Summary (Signed)
Physician Discharge Summary  Taylor Ramsey GNF:[id number] DOB: [birth year] DOA: 10/19/2022  PCP: [facility]  Admit date: 10/19/2022 Discharge date: 10/22/2022  Admitted From: Home Disposition: Home  Recommendations for Outpatient Follow-up:  Follow up with PCP in 1-2 weeks Please obtain BMP/CBC in one week Please follow up on the following pending results:  Home Health: N/A Equipment/Devices: N/A  Discharge Condition: Stable CODE STATUS: Full code Diet recommendation: Regular diet  Discharge summary: 22 year old, 20 months postpartum but not lactating, history of nephrolithiasis ureteric stents presented with 1 day of nausea vomiting, right flank pain and fever.  Urine was abnormal.  CT scan was consistent with perinephric fat stranding on the right side without hydronephrosis or obstructing stone.  Significant symptoms on presentation and high fever.  Admitted due to pyelonephritis, treated with IV ceftriaxone.  Blood cultures without any growth.  Urine culture with E. coli.  Clinically improving.  Discharging home with Keflex for 7 additional days to treat as complicated UTI and pyelonephritis.  She was also prescribed a short course of oxycodone for flank pain.  Remains fairly stable to discharge home.  Patient had significantly lower potassium and phosphorus with magnesium on presentation.  Replaced aggressively with improvement.   Discharge Diagnoses:  Principal Problem:   Pyelonephritis of right kidney Active Problems:   Hypoglycemia   Normocytic anemia   Acute pyelonephritis    Discharge Instructions  Discharge Instructions     Diet general   Complete by: As directed    Increase activity slowly   Complete by: As directed       Allergies as of 10/22/2022   No Known Allergies      Medication List     TAKE these medications    acetaminophen 325 MG tablet Commonly known as: TYLENOL Take 2 tablets (650 mg total) by mouth every 6 (six)  hours as needed for mild pain (or Fever >/= 101).   cephALEXin 500 MG capsule Commonly known as: KEFLEX Take 1 capsule (500 mg total) by mouth 3 (three) times daily for 7 days.   ibuprofen 200 MG tablet Commonly known as: ADVIL Take 400 mg by mouth 2 (two) times daily as needed for moderate pain.   oxyCODONE 5 MG immediate release tablet Commonly known as: Oxy IR/ROXICODONE Take 1 tablet (5 mg total) by mouth every 4 (four) hours as needed for up to 5 days for severe pain.        No Known Allergies  Consultations: None   Procedures/Studies: CT ABDOMEN PELVIS W CONTRAST  Result Date: 10/20/2022 CLINICAL DATA:  Pyelonephritis suspected, flank pain and nausea. EXAM: CT ABDOMEN AND PELVIS WITH CONTRAST TECHNIQUE: Multidetector CT imaging of the abdomen and pelvis was performed using the standard protocol following bolus administration of intravenous contrast. RADIATION DOSE REDUCTION: This exam was performed according to the departmental dose-optimization program which includes automated exposure control, adjustment of the mA and/or kV according to patient size and/or use of iterative reconstruction technique. CONTRAST:  75mL OMNIPAQUE IOHEXOL 350 MG/ML SOLN COMPARISON:  01/24/2022. FINDINGS: Lower chest: Atelectasis is present in the lower lobes bilaterally. Hepatobiliary: Hypodense region is present in the left lobe of the liver adjacent to the falciform ligament, possible focal fatty infiltration. No biliary ductal dilatation. The gallbladder is without stones. Pancreas: Unremarkable. No pancreatic ductal dilatation or surrounding inflammatory changes. Spleen: Normal in size without focal abnormality. Adrenals/Urinary Tract: The adrenal glands are within normal limits. Mild patchy hypoenhancement is noted in the right kidney. No renal  calculus or obstructive uropathy bilaterally. There is urothelial enhancement on the right with perinephric fat stranding. The bladder is unremarkable.  Stomach/Bowel: Stomach is within normal limits. Appendix appears normal. No evidence of bowel wall thickening, distention, or inflammatory changes. No free air or pneumatosis. Vascular/Lymphatic: No significant vascular findings are present. No enlarged abdominal or pelvic lymph nodes. Reproductive: IUD is present in the uterus.  No adnexal mass. Other: Trace amount of free fluid is noted in the cul-de-sac. Musculoskeletal: No acute osseous abnormality. IMPRESSION: Patchy hypoenhancement of the right kidney and urothelial enhancement on the right with surrounding fat stranding, compatible with pyelonephritis/ureteritis. No renal calculus or obstructive uropathy. Electronically Signed   By: Thornell Sartorius M.D.   On: 10/20/2022 02:33   (Echo, Carotid, EGD, Colonoscopy, ERCP)    Subjective: Seen in morning rounds.  Afebrile last 24 hours.  Mother at the bedside.  He still has mild to moderate right flank pain.  Denies any difficulty urinating.   Discharge Exam: Vitals:   10/21/22 2025 10/22/22 0618  BP: 122/83 113/75  Pulse: 75 66  Resp: 18 17  Temp: 98.2 F (36.8 C) 97.9 F (36.6 C)  SpO2: 97% 97%   Vitals:   10/21/22 1316 10/21/22 2025 10/22/22 0500 10/22/22 0618  BP: 110/83 122/83  113/75  Pulse: 78 75  66  Resp: 18 18  17   Temp: 97.8 F (36.6 C) 98.2 F (36.8 C)  97.9 F (36.6 C)  TempSrc: Oral Oral  Oral  SpO2: 92% 97%  97%  Weight:   65 kg   Height:        General: Pt is alert, awake, not in acute distress Cardiovascular: RRR, S1/S2 +, no rubs, no gallops Respiratory: CTA bilaterally, no wheezing, no rhonchi Abdominal: Soft, moderate tenderness on right costophrenic angle tapping.  ND, bowel sounds + Extremities: no edema, no cyanosis    The results of significant diagnostics from this hospitalization (including imaging, microbiology, ancillary and laboratory) are listed below for reference.     Microbiology: Recent Results (from the past 240 hour(s))  Urine Culture      Status: Abnormal   Collection Time: 10/19/22 11:46 PM   Specimen: Urine, Random  Result Value Ref Range Status   Specimen Description URINE, RANDOM  Final   Special Requests   Final    NONE Reflexed from [id number] Performed at [facility], [address] [place], [place] [address]    Culture >=100,000 COLONIES/mL ESCHERICHIA COLI (A)  Final   Report Status 10/22/2022 FINAL  Final   Organism ID, Bacteria ESCHERICHIA COLI (A)  Final      Susceptibility   Escherichia coli - MIC*    AMPICILLIN >=32 RESISTANT Resistant     CEFAZOLIN <=4 SENSITIVE Sensitive     CEFEPIME <=0.12 SENSITIVE Sensitive     CEFTRIAXONE <=0.25 SENSITIVE Sensitive     CIPROFLOXACIN <=0.25 SENSITIVE Sensitive     GENTAMICIN <=1 SENSITIVE Sensitive     IMIPENEM <=0.25 SENSITIVE Sensitive     NITROFURANTOIN <=16 SENSITIVE Sensitive     TRIMETH/SULFA <=20 SENSITIVE Sensitive     AMPICILLIN/SULBACTAM 16 INTERMEDIATE Intermediate     PIP/TAZO <=4 SENSITIVE Sensitive     * >=100,000 COLONIES/mL ESCHERICHIA COLI  Culture, blood (Routine X 2) w Reflex to ID Panel     Status: None (Preliminary result)   Collection Time: 10/20/22  2:26 PM   Specimen: BLOOD LEFT HAND  Result Value Ref Range Status   Specimen Description   Final  BLOOD LEFT HAND Performed at [facility], [address], [place], [place] [address]    Special Requests   Final    BOTTLES DRAWN AEROBIC AND ANAEROBIC Blood Culture adequate volume Performed at [facility], [address] [place], [place] [address]    Culture   Final    NO GROWTH 2 DAYS Performed at [facility], [address], [place], [place] [address]    Report Status PENDING  Incomplete  Culture, blood (Routine X 2) w Reflex to ID Panel     Status: None (Preliminary result)   Collection Time: 10/20/22  2:30 PM   Specimen: BLOOD  Result Value Ref Range Status   Specimen Description   Final    BLOOD SITE NOT SPECIFIED Performed at [facility], [address] [place], [place] [address]    Special Requests   Final    BOTTLES DRAWN AEROBIC AND ANAEROBIC Blood Culture adequate volume Performed at [facility], [address], [place], [place] [address]    Culture   Final    NO GROWTH 2 DAYS Performed at [facility], [address] [place], [place] [address]    Report Status PENDING  Incomplete     Labs: BNP (last 3 results) No results for input(s): "BNP" in the last 8760 hours. Basic Metabolic Panel: Recent Labs  Lab 10/20/22 0100 10/21/22 0420 10/22/22 0426  NA 141 135 136  K 3.9 2.9* 4.6  CL 107 105 106  CO2 19* 21* 21*  GLUCOSE 58* 125* 81  BUN 9 <5* <5*  CREATININE 0.83 0.54 0.52  CALCIUM 8.4* 8.1* 8.6*  MG  --  1.7 2.1  PHOS  --  2.0*  --    Liver Function Tests: Recent Labs  Lab 10/20/22 0100 10/21/22 0420  AST 24 13*  ALT 15 13  ALKPHOS 45 42  BILITOT 1.1 0.4  PROT 6.6 5.9*  ALBUMIN 3.8 3.0*   No results for input(s): "LIPASE", "AMYLASE" in the last 168 hours. No results for input(s): "AMMONIA" in the last 168 hours. CBC: Recent Labs  Lab 10/20/22 0100 10/21/22 0420 10/22/22 0426  WBC 8.9 12.3* 9.3  NEUTROABS 7.6 10.9*  --   HGB 11.6* 10.1* 10.2*  HCT 37.6 31.4* 33.0*  MCV 100.0 99.1 99.4  PLT 165 159 171   Cardiac Enzymes: No results for input(s): "CKTOTAL", "CKMB", "CKMBINDEX", "TROPONINI" in the last 168 hours. BNP: Invalid input(s): "POCBNP" CBG: Recent Labs  Lab 10/20/22 0341 10/20/22 0426 10/20/22 0459 10/21/22 0200  GLUCAP 48* 144* 122* 116*   D-Dimer No results for input(s): "DDIMER" in the last 72 hours. Hgb A1c No results for input(s): "HGBA1C" in the last 72 hours. Lipid Profile No results for input(s): "CHOL", "HDL", "LDLCALC", "TRIG", "CHOLHDL", "LDLDIRECT" in the last 72 hours. Thyroid function studies No results for input(s): "TSH", "T4TOTAL", "T3FREE", "THYROIDAB" in the last 72 hours.  Invalid input(s):  "FREET3" Anemia work up Recent Labs    10/20/22 1343  TIBC 283  IRON 28   Urinalysis    Component Value Date/Time   COLORURINE YELLOW 10/19/2022 2346   APPEARANCEUR CLEAR 10/19/2022 2346   LABSPEC 1.014 10/19/2022 2346   PHURINE 8.0 10/19/2022 2346   GLUCOSEU NEGATIVE 10/19/2022 2346   HGBUR NEGATIVE 10/19/2022 2346   BILIRUBINUR NEGATIVE 10/19/2022 2346   KETONESUR 20 (A) 10/19/2022 2346   PROTEINUR 30 (A) 10/19/2022 2346   UROBILINOGEN 0.2 09/10/2013 2247  NITRITE NEGATIVE 10/19/2022 2346   LEUKOCYTESUR SMALL (A) 10/19/2022 2346   Sepsis Labs Recent Labs  Lab 10/20/22 0100 10/21/22 0420 10/22/22 0426  WBC 8.9 12.3* 9.3   Microbiology Recent Results (from the past 240 hour(s))  Urine Culture     Status: Abnormal   Collection Time: 10/19/22 11:46 PM   Specimen: Urine, Random  Result Value Ref Range Status   Specimen Description URINE, RANDOM  Final   Special Requests   Final    NONE Reflexed from [phone] [id number] Performed at [facility], [address], [place], [place] [address]    Culture >=100,000 COLONIES/mL ESCHERICHIA COLI (A)  Final   Report Status 10/22/2022 FINAL  Final   Organism ID, Bacteria ESCHERICHIA COLI (A)  Final      Susceptibility   Escherichia coli - MIC*    AMPICILLIN >=32 RESISTANT Resistant     CEFAZOLIN <=4 SENSITIVE Sensitive     CEFEPIME <=0.12 SENSITIVE Sensitive     CEFTRIAXONE <=0.25 SENSITIVE Sensitive     CIPROFLOXACIN <=0.25 SENSITIVE Sensitive     GENTAMICIN <=1 SENSITIVE Sensitive     IMIPENEM <=0.25 SENSITIVE Sensitive     NITROFURANTOIN <=16 SENSITIVE Sensitive     TRIMETH/SULFA <=20 SENSITIVE Sensitive     AMPICILLIN/SULBACTAM 16 INTERMEDIATE Intermediate     PIP/TAZO <=4 SENSITIVE Sensitive     * >=100,000 COLONIES/mL ESCHERICHIA COLI  Culture, blood (Routine X 2) w Reflex to ID Panel     Status: None (Preliminary result)   Collection Time: 10/20/22  2:26 PM   Specimen: BLOOD LEFT HAND  Result Value Ref Range Status    Specimen Description   Final    BLOOD LEFT HAND Performed at [facility], [address], [place], [place] [address]    Special Requests   Final    BOTTLES DRAWN AEROBIC AND ANAEROBIC Blood Culture adequate volume Performed at [facility], [address] [place], [place] [address]    Culture   Final    NO GROWTH 2 DAYS Performed at [facility], [address], [place], [place] [address]    Report Status PENDING  Incomplete  Culture, blood (Routine X 2) w Reflex to ID Panel     Status: None (Preliminary result)   Collection Time: 10/20/22  2:30 PM   Specimen: BLOOD  Result Value Ref Range Status   Specimen Description   Final    BLOOD SITE NOT SPECIFIED Performed at [facility], [address] [place], [place] [address]    Special Requests   Final    BOTTLES DRAWN AEROBIC AND ANAEROBIC Blood Culture adequate volume Performed at [facility], [address] [place], [place] [address]    Culture   Final    NO GROWTH 2 DAYS Performed at [facility], [address] [place], [place] [address]    Report Status PENDING  Incomplete     Time coordinating discharge: 35 minutes  SIGNED:   Dorcas Carrow, MD  [facility] 10/22/2022, 10:43 AM

## 2022-10-22 NOTE — Plan of Care (Signed)

## 2022-10-25 LAB — CULTURE, BLOOD (ROUTINE X 2)
Culture: NO GROWTH
Culture: NO GROWTH
Special Requests: ADEQUATE
Special Requests: ADEQUATE

## 2023-05-20 IMAGING — US US OB < 14 WEEKS - US OB TV
1 series · 15 of 28 positions shown · non-contrast
Comparison: None.

CLINICAL DATA: Cramping tonight.

EXAM:
OBSTETRIC <14 WK US AND TRANSVAGINAL OB US
TECHNIQUE: Both transabdominal and transvaginal ultrasound examinations were
performed for complete evaluation of the gestation as well as the
maternal uterus, adnexal regions, and pelvic cul-de-sac.
Transvaginal technique was performed to assess early pregnancy.

[Series 1: us ob < 14 weeks - us ob tv · 15 of 53 slices shown]
[im 1/53]
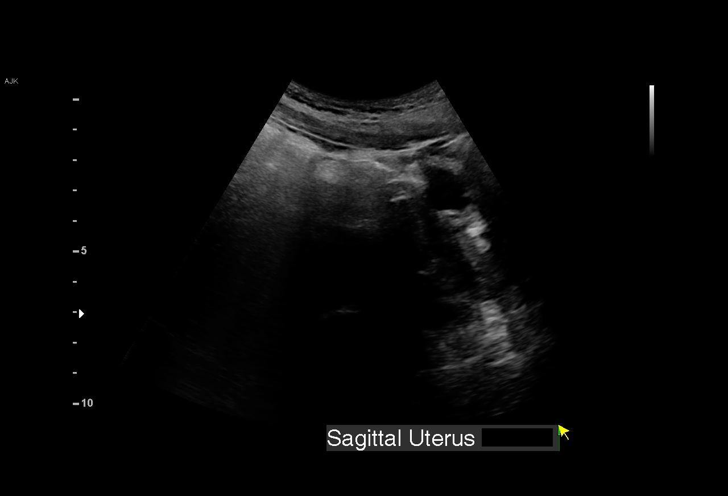
[im 4/53]
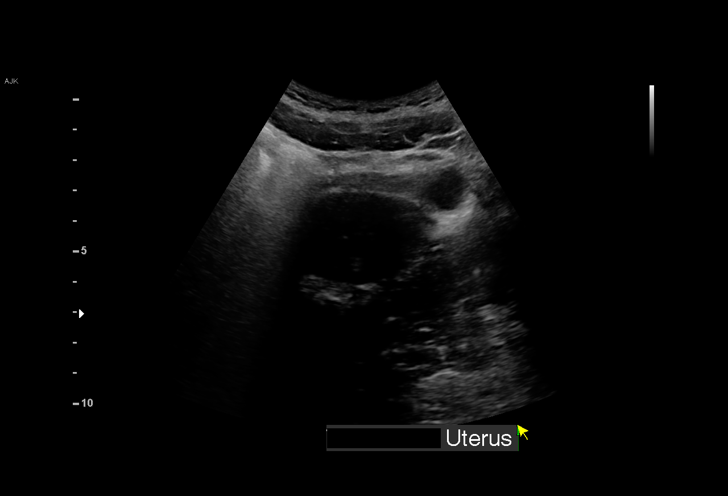
[im 8/53]
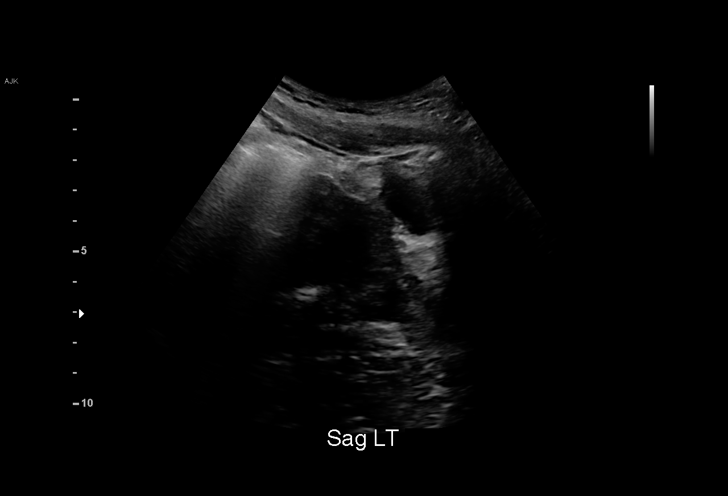
[im 12/53]
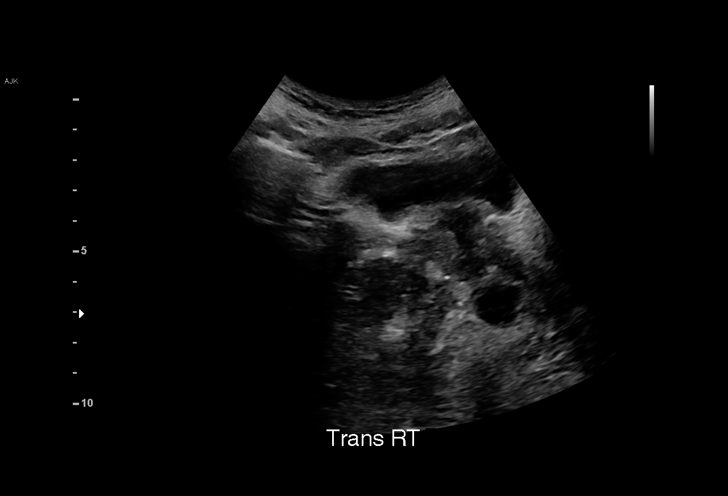
[im 16/53]
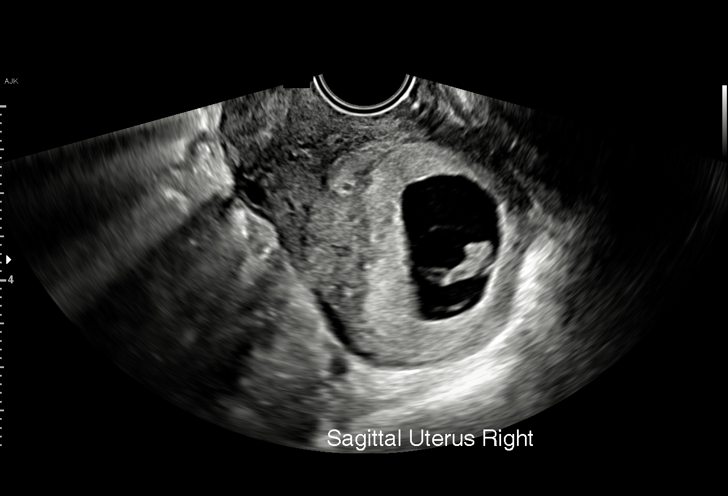
[im 20/53]
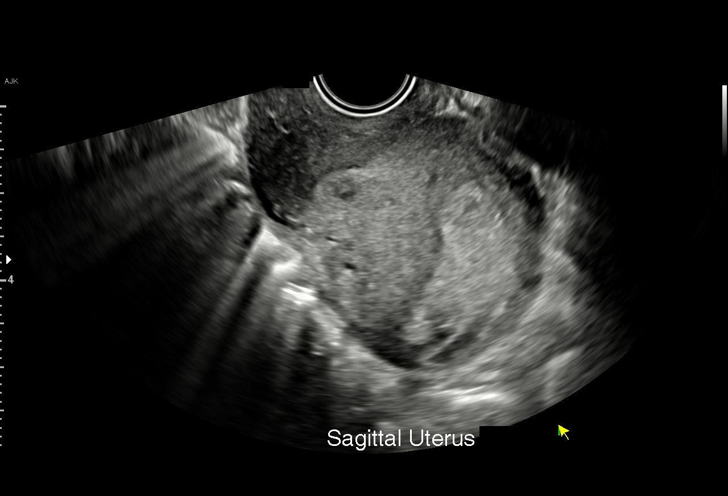
[im 24/53]
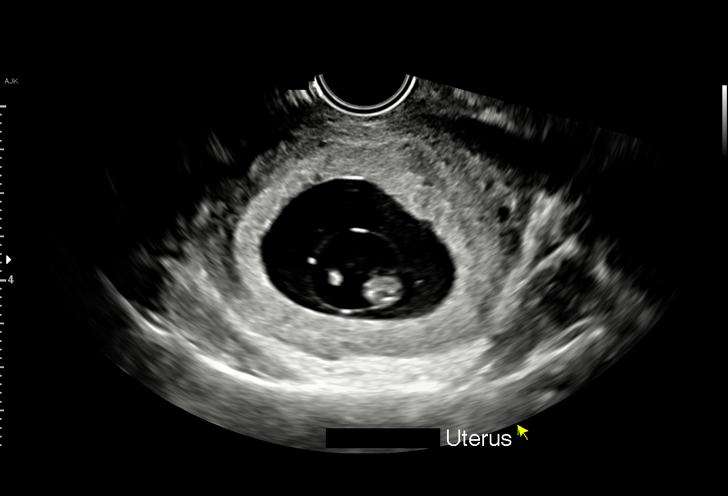
[im 27/53]
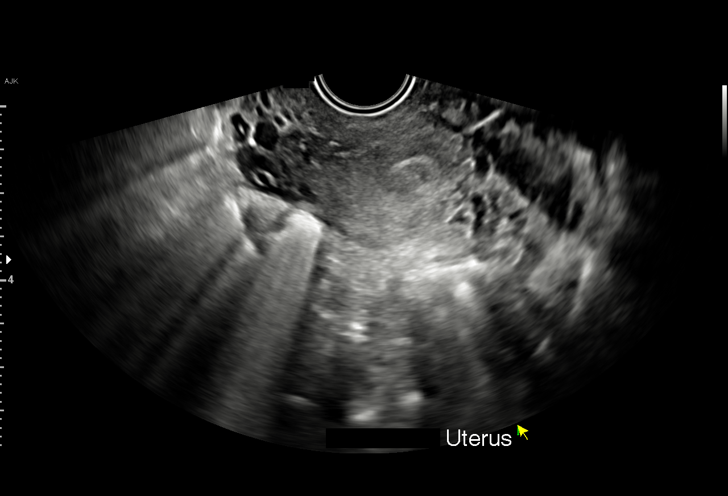
[im 29/53]
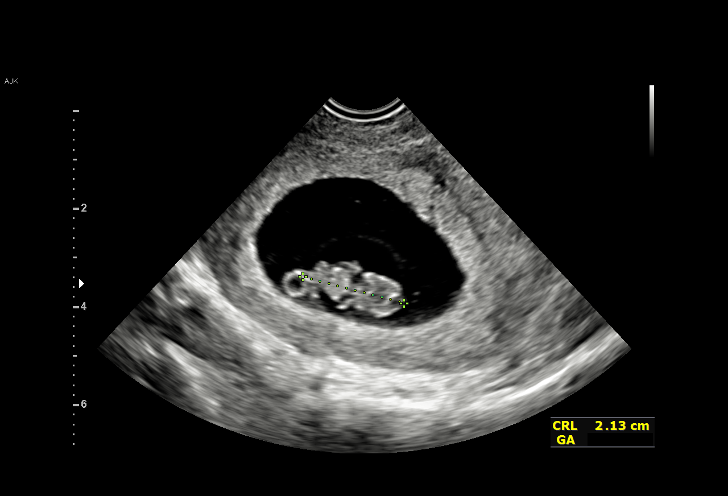
[im 33/53]
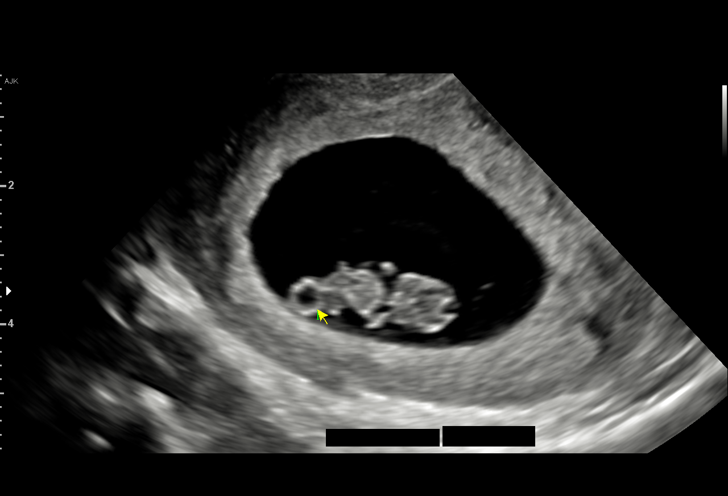
[im 37/53]
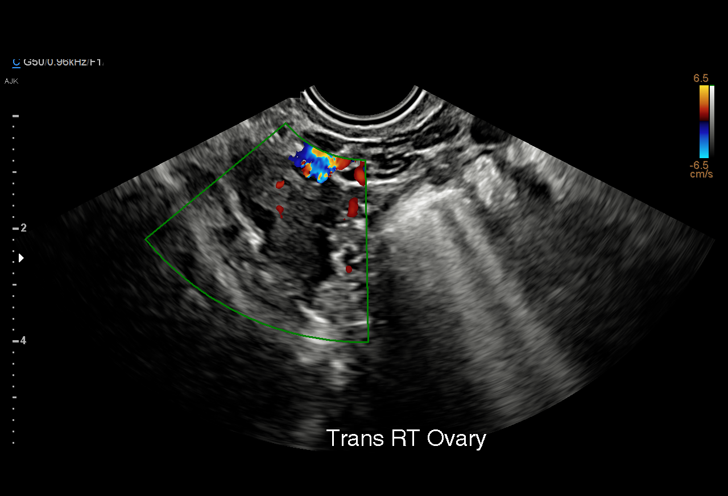
[im 41/53]
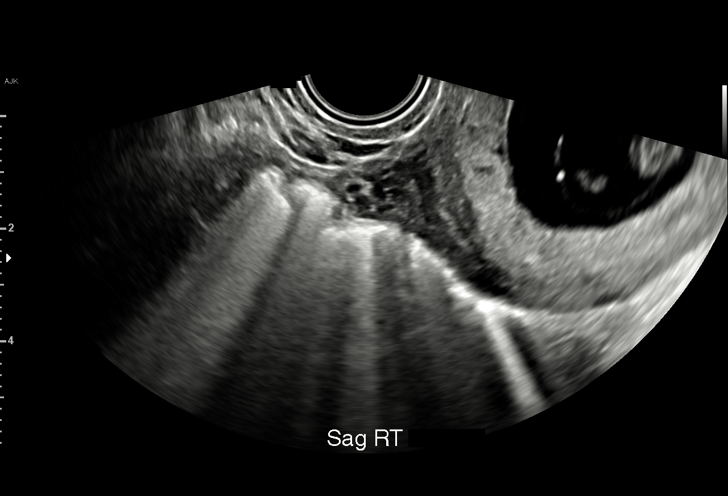
[im 45/53]
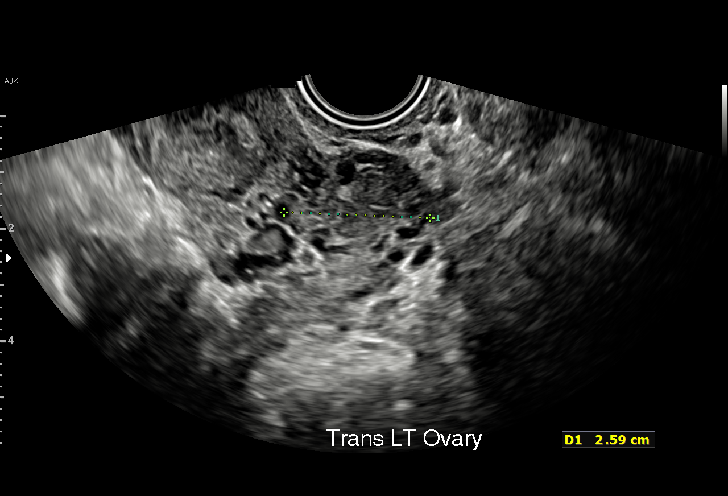
[im 49/53]
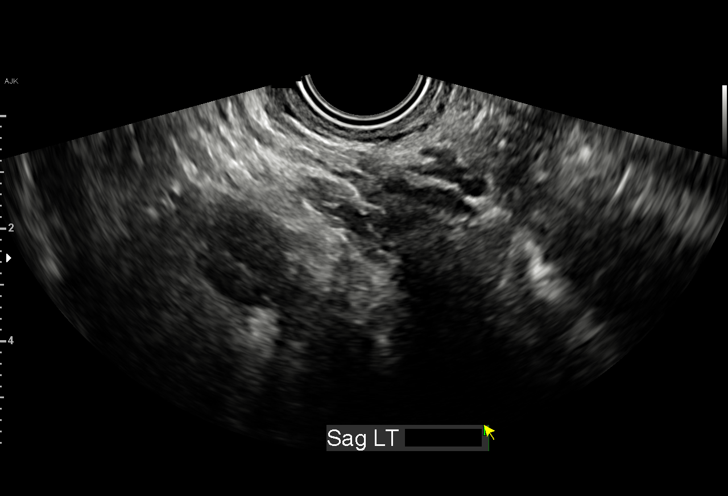
[im 53/53]
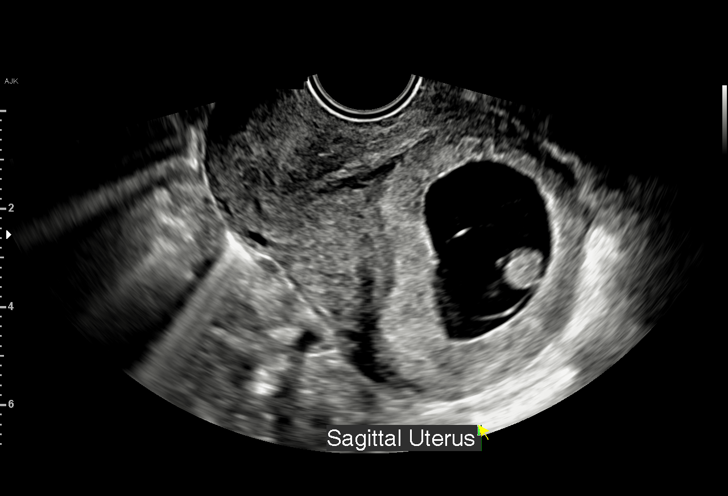

[15 of 28 positions shown; findings below may reference images not displayed]

FINDINGS: Intrauterine gestational sac: Single

Yolk sac:  Yes

Embryo:  Yes

Cardiac Activity: Yes

Heart Rate: 186 bpm

CRL:  21.1 mm   8 w   5 d                  US EDC: 01/03/2022.

Subchorionic hemorrhage:  None visualized.

Maternal uterus/adnexae: The uterus is retroverted. The ovaries are
within normal limits. No free fluid in the pelvis.
IMPRESSION: A single live intrauterine pregnancy with estimated gestational age
of 8 weeks 5 days. EDC: 01/03/2022.

## 2023-06-20 IMAGING — MR MR HEAD W/O CM
10 of 11 series · 42 of 48 positions shown · non-contrast
Comparison: Orbit CT 03/14/2004.

CLINICAL DATA: 20-year-old pregnant female with acute onset
abnormal speech, hyperventilating.

EXAM:
MRI HEAD WITHOUT CONTRAST
TECHNIQUE: Multiplanar, multiecho pulse sequences of the brain and surrounding
structures were obtained without intravenous contrast.

[Series 5: DWI · axial · 3.0mm · 0.88mm/px · z∈[-72,+54]mm · 9 of 96 slices shown (1 of 4)]
[im 1/96]
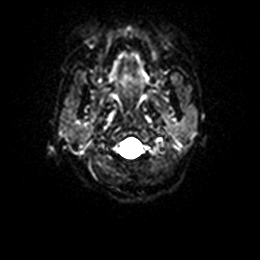
[im 12/96]
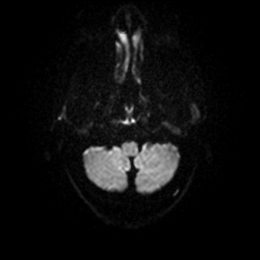
[im 24/96]
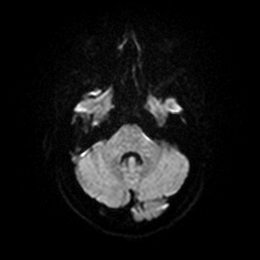
[im 36/96]
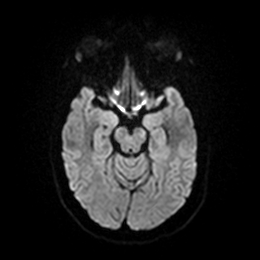
[im 48/96]
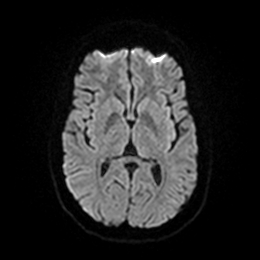
[im 60/96]
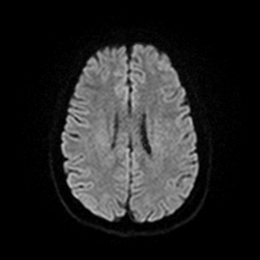
[im 72/96]
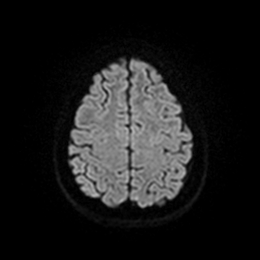
[im 84/96]
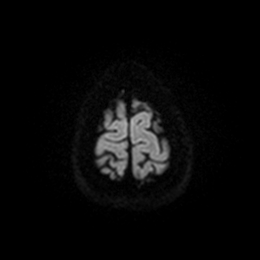
[im 96/96]
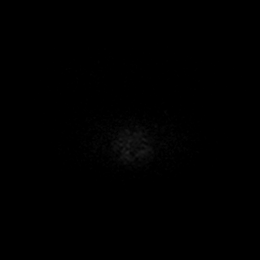

[Series 6: DWI · axial · 3.0mm · 0.88mm/px · z∈[-72,+54]mm · 4 of 48 slices shown (2 of 4)]
[im 1/48]
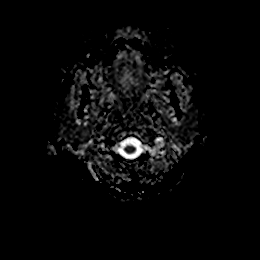
[im 16/48]
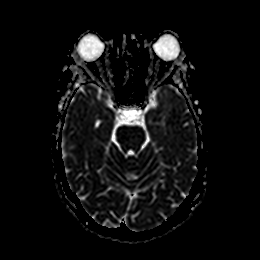
[im 32/48]
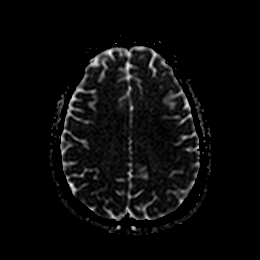
[im 48/48]
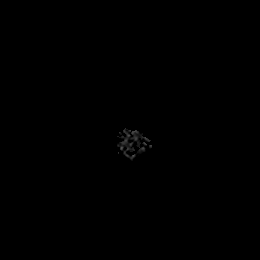

[Series 7: DWI · coronal · 4.0mm · 0.88mm/px · 6 of 64 slices shown (3 of 4)]
[im 1/64]
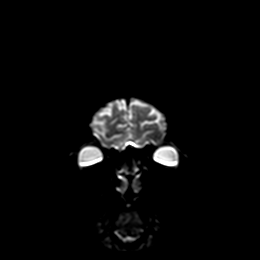
[im 13/64]
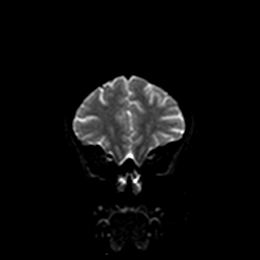
[im 26/64]
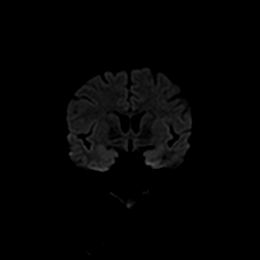
[im 38/64]
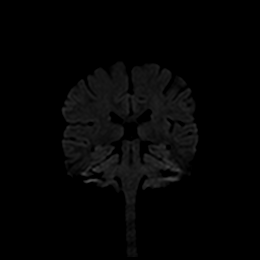
[im 51/64]
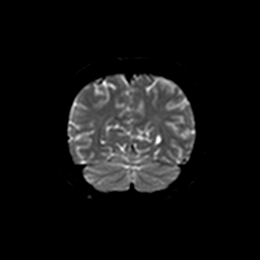
[im 64/64]
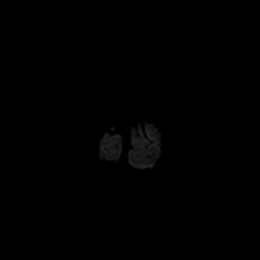

[Series 8: DWI · coronal · 4.0mm · 0.88mm/px · 3 of 32 slices shown (4 of 4)]
[im 1/32]
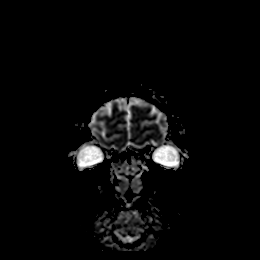
[im 16/32]
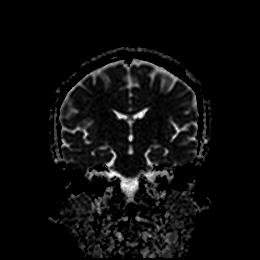
[im 32/32]
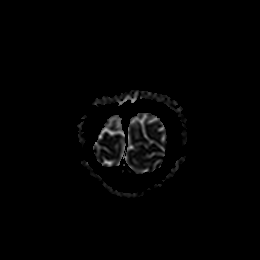

[Series 9: T1 · sagittal · 5.0mm · 0.75mm/px · 2 of 23 slices shown]
[im 1/23]
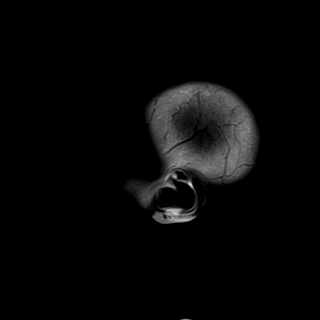
[im 23/23]
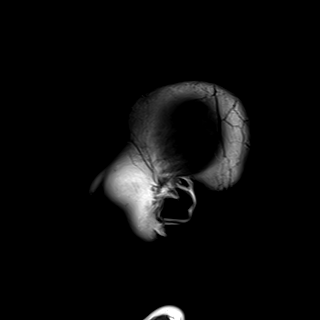

[Series 10: T2 · axial · 5.0mm · 0.72mm/px · z∈[-70,+59]mm · 2 of 25 slices shown (1 of 2)]
[im 1/25]
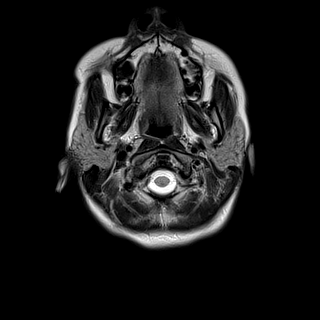
[im 25/25]
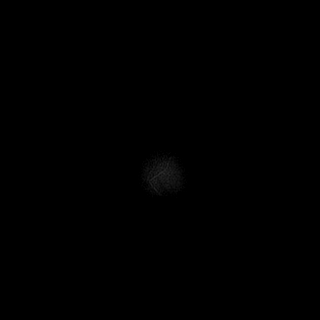

[Series 11: FLAIR · axial · 5.0mm · 0.45mm/px · z∈[-70,+58]mm · 2 of 25 slices shown]
[im 1/25]
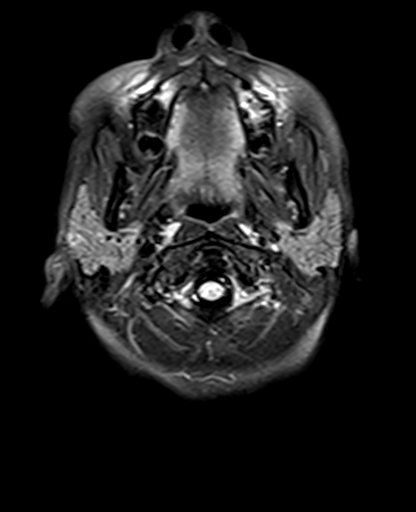
[im 25/25]
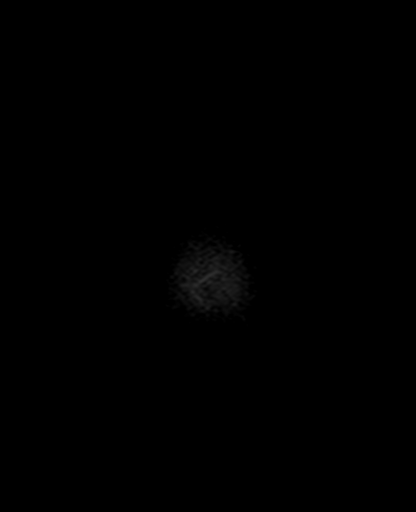

[Series 13: pha_images · axial · 3.0mm · 0.90mm/px · z∈[-90,+63]mm · 5 of 54 slices shown]
[im 1/54]
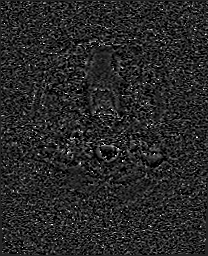
[im 14/54]
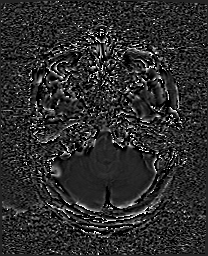
[im 27/54]
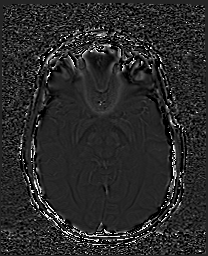
[im 40/54]
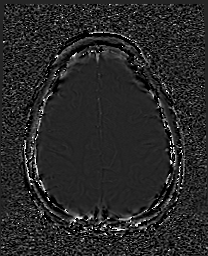
[im 54/54]
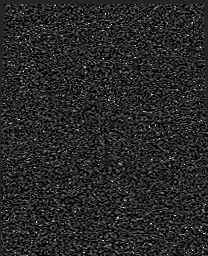

[Series 14: swi_images · axial · 3.0mm · 0.90mm/px · z∈[-90,+68]mm · 6 of 60 slices shown]
[im 1/60]
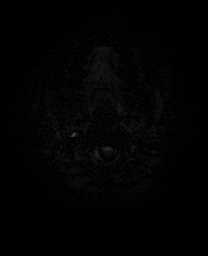
[im 12/60]
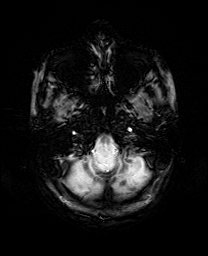
[im 24/60]
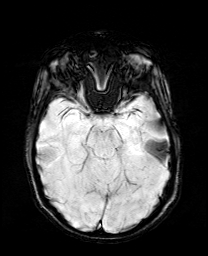
[im 36/60]
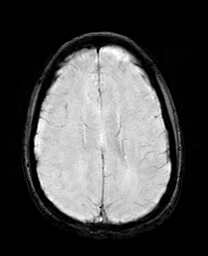
[im 48/60]
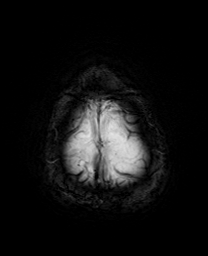
[im 60/60]
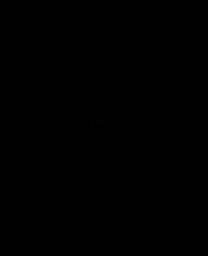

[Series 17: T2 · coronal · 5.0mm · 0.34mm/px · 3 of 29 slices shown (2 of 2)]
[im 1/29]
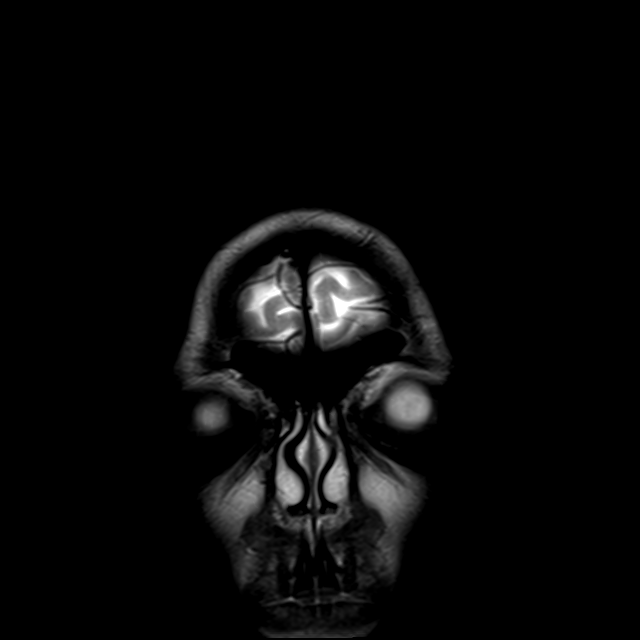
[im 15/29]
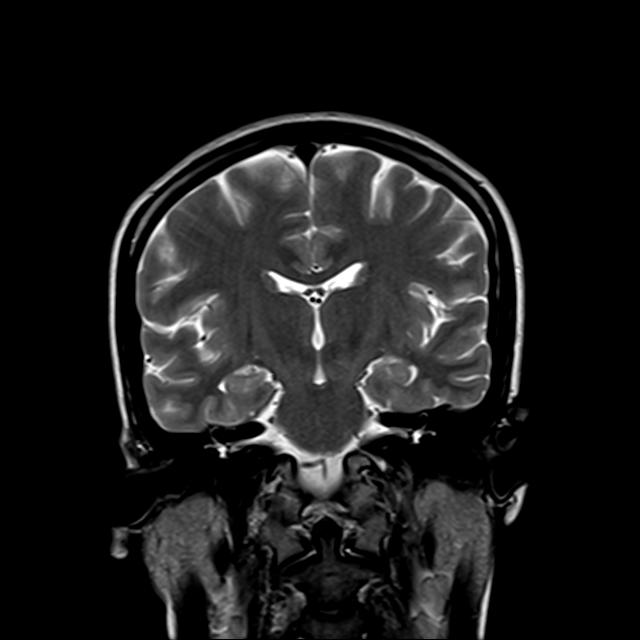
[im 29/29]
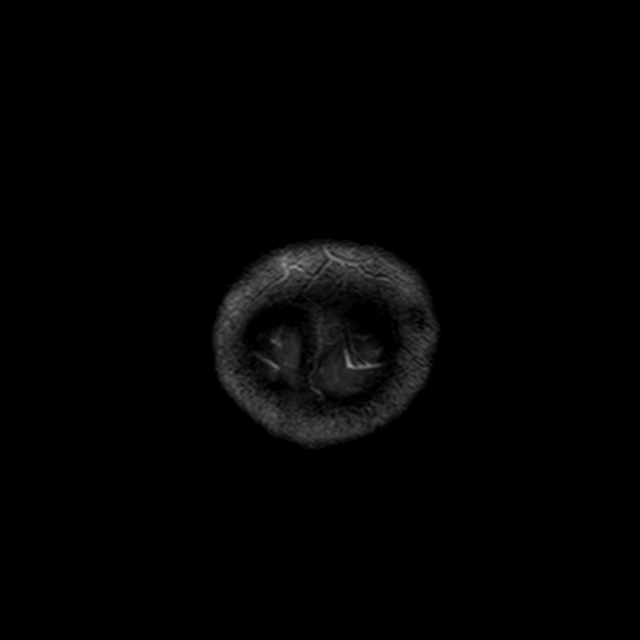

[42 of 48 positions shown; findings below may reference images not displayed]

FINDINGS: Brain: No restricted diffusion to suggest acute infarction. No
midline shift, mass effect, evidence of mass lesion,
ventriculomegaly, extra-axial collection or acute intracranial
hemorrhage. Cervicomedullary junction and pituitary are within
normal limits. Cerebral volume within normal limits.

Gray and white matter signal is within normal limits throughout the
brain. No cerebral edema, encephalomalacia, or chronic cerebral
blood products identified.

Vascular: Major intracranial vascular flow voids are preserved.

Skull and upper cervical spine: Negative visible cervical spine.
Visualized bone marrow signal is within normal limits.

Sinuses/Orbits: Negative orbits. Paranasal sinuses and mastoids are
clear.

Other: Grossly normal visible internal auditory structures. Negative
visible scalp and face.
IMPRESSION: Normal noncontrast MRI appearance of the brain.

## 2023-07-01 NOTE — Telephone Encounter (Signed)
Preadmission screening 

## 2023-09-26 ENCOUNTER — Emergency Department (HOSPITAL_COMMUNITY)
Admission: EM | Admit: 2023-09-26 | Discharge: 2023-09-27 | Disposition: A | Discharge status: Home / Self Care | Attending: Emergency Medicine | Admitting: Emergency Medicine

## 2023-09-26 ENCOUNTER — Encounter (HOSPITAL_COMMUNITY): Payer: Self-pay

## 2023-09-26 ENCOUNTER — Emergency Department (HOSPITAL_COMMUNITY)

## 2023-09-26 ENCOUNTER — Other Ambulatory Visit: Payer: Self-pay

## 2023-09-26 DIAGNOSIS — R109 Unspecified abdominal pain: Secondary | ICD-10-CM | POA: Diagnosis present

## 2023-09-26 DIAGNOSIS — N3000 Acute cystitis without hematuria: Secondary | ICD-10-CM | POA: Diagnosis not present

## 2023-09-26 LAB — COMPREHENSIVE METABOLIC PANEL WITH GFR
ALT: 16 U/L (ref 0–44)
AST: 26 U/L (ref 15–41)
Albumin: 4.1 g/dL (ref 3.5–5.0)
Alkaline Phosphatase: 52 U/L (ref 38–126)
Anion gap: 9 (ref 5–15)
BUN: 13 mg/dL (ref 6–20)
CO2: 21 mmol/L — ABNORMAL LOW (ref 22–32)
Calcium: 9.2 mg/dL (ref 8.9–10.3)
Chloride: 107 mmol/L (ref 98–111)
Creatinine, Ser: 0.48 mg/dL (ref 0.44–1.00)
GFR, Estimated: >60 mL/min (ref >60)
Glucose, Bld: 82 mg/dL (ref 70–99)
Potassium: 4 mmol/L (ref 3.5–5.1)
Sodium: 137 mmol/L (ref 135–145)
Total Bilirubin: 0.9 mg/dL (ref 0.0–1.2)
Total Protein: 7.9 g/dL (ref 6.5–8.1)

## 2023-09-26 LAB — CBC WITH DIFFERENTIAL/PLATELET
Abs Immature Granulocytes: 0.01 K/uL (ref 0.00–0.07)
Basophils Absolute: 0 K/uL (ref 0.0–0.1)
Basophils Relative: 0 %
Eosinophils Absolute: 0 K/uL (ref 0.0–0.5)
Eosinophils Relative: 0 %
HCT: 36.7 % (ref 36.0–46.0)
Hemoglobin: 11.6 g/dL — ABNORMAL LOW (ref 12.0–15.0)
Immature Granulocytes: 0 %
Lymphocytes Relative: 22 %
Lymphs Abs: 1.1 K/uL (ref 0.7–4.0)
MCH: 30.9 pg (ref 26.0–34.0)
MCHC: 31.6 g/dL (ref 30.0–36.0)
MCV: 97.9 fL (ref 80.0–100.0)
Monocytes Absolute: 0.2 K/uL (ref 0.1–1.0)
Monocytes Relative: 4 %
Neutro Abs: 3.6 K/uL (ref 1.7–7.7)
Neutrophils Relative %: 74 %
Platelets: 202 K/uL (ref 150–400)
RBC: 3.75 MIL/uL — ABNORMAL LOW (ref 3.87–5.11)
RDW: 12.1 % (ref 11.5–15.5)
WBC: 4.8 K/uL (ref 4.0–10.5)
nRBC: 0 % (ref 0.0–0.2)

## 2023-09-26 LAB — URINALYSIS, ROUTINE W REFLEX MICROSCOPIC
Bilirubin Urine: NEGATIVE
Glucose, UA: NEGATIVE mg/dL
Ketones, ur: 20 mg/dL — AB
Leukocytes,Ua: NEGATIVE
Nitrite: NEGATIVE
Protein, ur: NEGATIVE mg/dL
Specific Gravity, Urine: 1.021 (ref 1.005–1.030)
pH: 7 (ref 5.0–8.0)

## 2023-09-26 LAB — HCG, QUANTITATIVE, PREGNANCY: hCG, Beta Chain, Quant, S: <1 m[IU]/mL (ref <5)

## 2023-09-26 LAB — LIPASE, BLOOD: Lipase: 37 U/L (ref 11–51)

## 2023-09-26 MED ORDER — CEFADROXIL 500 MG PO CAPS: 1000.0000 mg | ORAL_CAPSULE | Freq: Two times a day (BID) | ORAL | 0 refills | Status: AC

## 2023-09-26 MED ORDER — SODIUM CHLORIDE 0.9 % IV BOLUS
1000.0000 mL | Freq: Once | INTRAVENOUS | Status: AC
  Administered 2023-09-26: 1000 mL via INTRAVENOUS

## 2023-09-26 MED ORDER — FENTANYL CITRATE PF 50 MCG/ML IJ SOSY
50.0000 ug | PREFILLED_SYRINGE | Freq: Once | INTRAMUSCULAR | Status: AC
  Administered 2023-09-26: 50 ug via INTRAVENOUS
  Filled 2023-09-26: qty 1

## 2023-09-26 MED ORDER — IOHEXOL 300 MG/ML  SOLN
100.0000 mL | Freq: Once | INTRAMUSCULAR | Status: AC | PRN
  Administered 2023-09-26: 100 mL via INTRAVENOUS

## 2023-09-26 NOTE — ED Provider Notes (Signed)
 [place] [facility] AT [facility] Provider Note   CSN: [id number] Arrival date & time: 09/26/23  2047     Patient presents with: Abdominal Pain and Flank Pain   Taylor Ramsey is a 23 y.o. female.  Patient with past history significant for recurrent pyelonephritis of the right kidney presents the emergency department with concerns of abdominal pain and flank pain.  Reports the symptoms began over the last 3 days with associated chills with no reported fever or bodyaches.  Reports that this is typically how her symptoms began when she develops pyelonephritis.  She states that she has not been treated with any antibiotics recently.  No other reported symptoms.  Denies nausea, vomiting, diarrhea.   Abdominal Pain Flank Pain Associated symptoms include abdominal pain.       Prior to Admission medications   Medication Sig Start Date End Date Taking? Authorizing Provider  cefadroxil  (DURICEF) 500 MG capsule Take 2 capsules (1,000 mg total) by mouth 2 (two) times daily for 7 days. 09/26/23 10/03/23 Yes Jovita Persing A, PA-C  acetaminophen  (TYLENOL ) 325 MG tablet Take 2 tablets (650 mg total) by mouth every 6 (six) hours as needed for mild pain (or Fever >/= 101). 10/22/22   Raenelle Coria, MD  ibuprofen  (ADVIL ) 200 MG tablet Take 400 mg by mouth 2 (two) times daily as needed for moderate pain.    [provider]    Allergies: Patient has no known allergies.    Review of Systems  Gastrointestinal:  Positive for abdominal pain.  Genitourinary:  Positive for flank pain.  All other systems reviewed and are negative.   Updated Vital Signs BP 110/84   Pulse 62   Temp 98.8 F (37.1 C) (Oral)   Resp 18   Ht 5' 2 (1.575 m)   Wt 52.2 kg   SpO2 100%   BMI 21.03 kg/m   Physical Exam Vitals and nursing note reviewed.  Constitutional:      General: She is not in acute distress.    Appearance: She is well-developed. She is not ill-appearing.  HENT:      Head: Normocephalic and atraumatic.  Eyes:     Conjunctiva/sclera: Conjunctivae normal.  Cardiovascular:     Rate and Rhythm: Normal rate and regular rhythm.     Heart sounds: No murmur heard. Pulmonary:     Effort: Pulmonary effort is normal. No respiratory distress.     Breath sounds: Normal breath sounds.  Abdominal:     Palpations: Abdomen is soft.     Tenderness: There is no abdominal tenderness. There is right CVA tenderness. There is no left CVA tenderness.  Musculoskeletal:        General: No swelling.     Cervical back: Neck supple.  Skin:    General: Skin is warm and dry.     Capillary Refill: Capillary refill takes less than 2 seconds.  Neurological:     Mental Status: She is alert.  Psychiatric:        Mood and Affect: Mood normal.     (all labs ordered are listed, but only abnormal results are displayed) Labs Reviewed  CBC WITH DIFFERENTIAL/PLATELET - Abnormal; Notable for the following components:      Result Value   RBC 3.75 (*)    Hemoglobin 11.6 (*)    All other components within normal limits  COMPREHENSIVE METABOLIC PANEL WITH GFR - Abnormal; Notable for the following components:   CO2 21 (*)    All other  components within normal limits  URINALYSIS, ROUTINE W REFLEX MICROSCOPIC - Abnormal; Notable for the following components:   APPearance HAZY (*)    Hgb urine dipstick MODERATE (*)    Ketones, ur 20 (*)    Bacteria, UA RARE (*)    All other components within normal limits  URINE CULTURE  LIPASE, BLOOD  HCG, QUANTITATIVE, PREGNANCY    EKG: None  Radiology: CT ABDOMEN PELVIS W CONTRAST Result Date: 09/26/2023 CLINICAL DATA:  Abdominal pain, flank pain EXAM: CT ABDOMEN AND PELVIS WITH CONTRAST TECHNIQUE: Multidetector CT imaging of the abdomen and pelvis was performed using the standard protocol following bolus administration of intravenous contrast. RADIATION DOSE REDUCTION: This exam was performed according to the departmental dose-optimization  program which includes automated exposure control, adjustment of the mA and/or kV according to patient size and/or use of iterative reconstruction technique. CONTRAST:  OMNIPAQUE  IOHEXOL  300 MG/ML  SOLN COMPARISON:  10/20/2022 FINDINGS: Lower chest: No acute abnormality Hepatobiliary: No focal hepatic abnormality. Gallbladder unremarkable. Pancreas: No focal abnormality or ductal dilatation. Spleen: No focal abnormality.  Normal size. Adrenals/Urinary Tract: No adrenal abnormality. No focal renal abnormality. No stones or hydronephrosis. Urinary bladder is unremarkable. Stomach/Bowel: Normal appendix. Stomach, large and small bowel grossly unremarkable. Vascular/Lymphatic: No evidence of aneurysm or adenopathy. Reproductive: Retroverted uterus. IUD in the uterus. No adnexal mass. Other: No free fluid or free air. Musculoskeletal: No acute bony abnormality. IMPRESSION: No acute findings in the abdomen or pelvis. Electronically Signed   By: Franky Crease M.D.   On: 09/26/2023 23:51     Procedures   Medications Ordered in the ED  fentaNYL  (SUBLIMAZE ) injection 50 mcg (50 mcg Intravenous Given 09/26/23 2223)  sodium chloride  0.9 % bolus 1,000 mL (0 mLs Intravenous Stopped 09/26/23 2325)  iohexol  (OMNIPAQUE ) 300 MG/ML solution 100 mL (100 mLs Intravenous Contrast Given 09/26/23 2332)                                    Medical Decision Making Amount and/or Complexity of Data Reviewed Labs: ordered.   This patient presents to the ED for concern of abdominal pain, flank pain, this involves an extensive number of treatment options, and is a complaint that carries with it a high risk of complications and morbidity.  The differential diagnosis includes pyelonephritis, urolithiasis, bowel obstruction, diverticulitis, appendicitis   Co morbidities that complicate the patient evaluation  Multiple episodes of pyelonephritis   Lab Tests:  I Ordered, and personally interpreted labs.  The pertinent  results include: CBC unremarkable with no evidence of leukocytosis, CMP with no evidence of AKI or other renal dysfunction, UA with some blood present and some bacteria but no nitrites or leukocytes, lipase unremarkable, hCG negative at less than 1   Imaging Studies ordered:  I ordered imaging studies including CT abdomen pelvis I independently visualized and interpreted imaging which showed pending I agree with the radiologist interpretation    Consultations Obtained:  I requested consultation with none,  and discussed lab and imaging findings as well as pertinent plan - they recommend: N/A   Problem List / ED Course / Critical interventions / Medication management  Patient with past history significant for multiple episodes of pyelonephritis presents emergency department concerns of flank pain.  Reports he was symptoms began about 3 days ago with some associated chills but denies any vomiting, diarrhea, or fevers.  Denies any significant urinary discomfort. On exam, patient  with right-sided CVA tenderness.  Some generalized abdominal tenderness also present.  Vitals unremarkable at this time.  Not meeting SIRS criteria.  Given history of pyelonephritis, we will proceed with lab workup for evaluation including CT imaging of the abdomen to rule out other possible etiology of current symptoms include bowel obstruction, appendicitis, nephritis, urolithiasis, versus other..  Fentanyl  and fluids ordered for symptom control.  Will reassess shortly. Interval improvement in pain control. Pending CT at this time. Patient improved on reevaluation.  CT imaging negative at this time.  Given patient's prior history of pyelonephritis and some bacterial urine, I am concerned that patient may be developing early symptoms at this time with pyelonephritis.  Will start patient on a course of antibiotics with urine culture pending.  Advise return precautions such as concerns for new or worsening symptoms.   Otherwise stable for outpatient follow-up and discharged home. I ordered medication including fluids, fentanyl  for dehydration, pain Reevaluation of the patient after these medicines showed that the patient improved I have reviewed the patients home medicines and have made adjustments as needed   Test / Admission - Considered:  Pending per oncoming team.  Final diagnoses:  Acute cystitis without hematuria    ED Discharge Orders          Ordered    cefadroxil  (DURICEF) 500 MG capsule  2 times daily        09/26/23 2359               Ociel Retherford A, PA-C 09/27/23 0001    Zackowski, Scott, MD 10/06/23 1035

## 2023-09-26 NOTE — ED Triage Notes (Signed)
 Pt to Ed by EMS from home with c/o Abdominal pain and flank pain and chills for the past three days. Pt states has been getting progressively worse since onset, pt has a history of the same. Arrives A+O, VSS, NADN.

## 2023-09-27 NOTE — Discharge Instructions (Signed)
 You are seen in the emergency department today for concerns of abdominal pain and flank pain.  Your labs and imaging were thankfully reassuring although your urine does signs of possible infection as there are some bacteria present but no other abnormalities.  Given your history of kidney infection, I am starting you on antibiotics for the next 4 days.  Please take this as prescribed.  For any concerns of new or worsening symptoms, return to the emergency department.

## 2023-09-28 LAB — URINE CULTURE: Special Requests: NORMAL
